# Patient Record
Sex: Female | Born: 1990 | Race: Black or African American | Hispanic: No | Marital: Single | Smoking: Former smoker
Health system: Southern US, Community
[De-identification: ages and names within clinical notes are randomized; demographics above are authoritative.]

## PROBLEM LIST (undated history)

## (undated) DIAGNOSIS — O99213 Obesity complicating pregnancy, third trimester: Principal | ICD-10-CM

## (undated) DIAGNOSIS — K219 Gastro-esophageal reflux disease without esophagitis: Secondary | ICD-10-CM

## (undated) DIAGNOSIS — E669 Obesity, unspecified: Secondary | ICD-10-CM

## (undated) DIAGNOSIS — M549 Dorsalgia, unspecified: Secondary | ICD-10-CM

## (undated) HISTORY — PX: NO PAST SURGERIES: SHX2092

---
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
## 2010-01-28 ENCOUNTER — Emergency Department (HOSPITAL_COMMUNITY): Admission: EM | Admit: 2010-01-28 | Discharge: 2010-01-28 | Payer: Self-pay | Admitting: Family Medicine

## 2010-09-26 LAB — POCT URINALYSIS DIP (DEVICE)
Hgb urine dipstick: NEGATIVE
Nitrite: NEGATIVE
Protein, ur: NEGATIVE mg/dL
Specific Gravity, Urine: 1.02 (ref 1.005–1.030)
Urobilinogen, UA: 0.2 mg/dL (ref 0.0–1.0)

## 2010-09-26 LAB — WET PREP, GENITAL
Clue Cells Wet Prep HPF POC: NONE SEEN
Trich, Wet Prep: NONE SEEN

## 2010-09-26 LAB — POCT PREGNANCY, URINE: Preg Test, Ur: NEGATIVE

## 2010-09-29 LAB — POCT URINALYSIS DIP (DEVICE)
Glucose, UA: NEGATIVE mg/dL
Nitrite: NEGATIVE

## 2010-09-29 LAB — URINE CULTURE: Colony Count: >=100000

## 2010-09-29 LAB — WET PREP, GENITAL
Clue Cells Wet Prep HPF POC: NONE SEEN
Trich, Wet Prep: NONE SEEN

## 2010-10-15 LAB — URINALYSIS, ROUTINE W REFLEX MICROSCOPIC
Leukocytes, UA: NEGATIVE
Nitrite: NEGATIVE
Protein, ur: NEGATIVE mg/dL
Urobilinogen, UA: 0.2 mg/dL (ref 0.0–1.0)

## 2010-10-15 LAB — PREGNANCY, URINE: Preg Test, Ur: NEGATIVE

## 2010-10-15 LAB — URINE MICROSCOPIC-ADD ON

## 2011-04-16 LAB — URINALYSIS, ROUTINE W REFLEX MICROSCOPIC
Bilirubin Urine: NEGATIVE
Glucose, UA: NEGATIVE mg/dL
Hgb urine dipstick: NEGATIVE
Ketones, ur: NEGATIVE mg/dL
Nitrite: NEGATIVE
Protein, ur: NEGATIVE mg/dL
Specific Gravity, Urine: 1.009 (ref 1.005–1.030)
Urobilinogen, UA: 0.2 mg/dL (ref 0.0–1.0)
pH: 6.5 (ref 5.0–8.0)

## 2011-04-16 LAB — PREGNANCY, URINE: Preg Test, Ur: NEGATIVE

## 2011-08-24 ENCOUNTER — Emergency Department (INDEPENDENT_AMBULATORY_CARE_PROVIDER_SITE_OTHER)
Admission: EM | Admit: 2011-08-24 | Discharge: 2011-08-24 | Disposition: A | Discharge status: Home / Self Care | Payer: Medicaid Other | Source: Home / Self Care | Attending: Family Medicine | Admitting: Family Medicine

## 2011-08-24 ENCOUNTER — Encounter (HOSPITAL_COMMUNITY): Payer: Self-pay | Admitting: *Deleted

## 2011-08-24 DIAGNOSIS — N760 Acute vaginitis: Secondary | ICD-10-CM

## 2011-08-24 DIAGNOSIS — S93409A Sprain of unspecified ligament of unspecified ankle, initial encounter: Secondary | ICD-10-CM

## 2011-08-24 DIAGNOSIS — S96919A Strain of unspecified muscle and tendon at ankle and foot level, unspecified foot, initial encounter: Secondary | ICD-10-CM

## 2011-08-24 LAB — POCT URINALYSIS DIP (DEVICE)
Ketones, ur: NEGATIVE mg/dL
Protein, ur: NEGATIVE mg/dL
Specific Gravity, Urine: >=1.03 (ref 1.005–1.030)
pH: 6 (ref 5.0–8.0)

## 2011-08-24 LAB — WET PREP, GENITAL

## 2011-08-24 MED ORDER — IBUPROFEN 600 MG PO TABS: 600.0000 mg | ORAL_TABLET | Freq: Three times a day (TID) | ORAL | Status: AC | PRN

## 2011-08-24 MED ORDER — FLUCONAZOLE 150 MG PO TABS: ORAL_TABLET | ORAL | Status: DC

## 2011-08-24 MED ORDER — METRONIDAZOLE 500 MG PO TABS: 500.0000 mg | ORAL_TABLET | Freq: Two times a day (BID) | ORAL | Status: AC

## 2011-08-24 NOTE — Discharge Instructions (Signed)
Take the prescribed medications as instructed. Removal of ankle brace to do stretching exercises once pain is better at least 3 times a day. Follow recommendations as per handout provided today. Return if worsening symptoms despite following treatment

## 2011-08-24 NOTE — ED Notes (Signed)
PT  REPORTS  VAGINAL  DISCHARGE   AND           R  FOOT  /  ANKLE  PAIN         -  SHE REPORTS  THE    ANKLE  HAS  BEEN  BOTHERING HER    FOR  ABUT  TWO  WEEKS  AS  WELL AS  THE  DIISCHARGE        SHE  DENYS   N ANY  BLEEDING OR  ANY  URINARY  SYMPTOMS

## 2011-08-24 NOTE — ED Provider Notes (Signed)
History     CSN: [id number]  Arrival date & time 08/24/11  1608   First MD Initiated Contact with Patient 08/24/11 1728      Chief Complaint  Patient presents with  . Vaginal Discharge    (Consider location/radiation/quality/duration/timing/severity/associated sxs/prior treatment) HPI Comments: 21 year old morbidly of these female here complaining of #1) vaginal irritation itchiness and white discharge for a month worse in the last 2 days has been treated for Candida vaginal infection in the past patient, is nulliparous not using birth control she has a same sex partner. Denies dysuria fever, chills, pelvic pain, nausea, vomiting or diarrhea   #2) right ankle pain for over a week she thinks she twisted her ankle over a week ago has not develop swelling but has noticed tenderness with putting weight on it when walking not taking any pain medications at this point wanted me to check. Patient weights over 300 pounds trying to lose weight.     History reviewed. No pertinent past medical history.  History reviewed. No pertinent past surgical history.  No family history on file.  History  Substance Use Topics  . Smoking status: Not on file  . Smokeless tobacco: Not on file  . Alcohol Use: Not on file    OB History    Grav Para Term Preterm Abortions TAB SAB Ect Mult Living                  Review of Systems  Constitutional: Negative for fever and chills.  Genitourinary: Positive for vaginal discharge. Negative for dysuria, frequency, hematuria and vaginal bleeding.  Musculoskeletal:       As per HPI  Neurological: Negative for dizziness and headaches.    Allergies  Review of patient's allergies indicates not on file.  Home Medications   Current Outpatient Rx  Name Route Sig Dispense Refill  . FLUCONAZOLE 150 MG PO TABS  1 tab po q72 hours x3 3 tablet 0  . IBUPROFEN 600 MG PO TABS Oral Take 1 tablet (600 mg total) by mouth every 8 (eight) hours as needed for pain.  20 tablet 0  . METRONIDAZOLE 500 MG PO TABS Oral Take 1 tablet (500 mg total) by mouth 2 (two) times daily. 14 tablet 0    BP 116/72  Pulse 70  Temp(Src) 98.6 F (37 C) (Oral)  Resp 18  LMP 08/12/2011  Physical Exam  Nursing note and vitals reviewed. Constitutional: She appears well-developed and well-nourished. No distress.  HENT:  Head: Normocephalic and atraumatic.  Cardiovascular: Normal heart sounds.   Pulmonary/Chest: Breath sounds normal.  Abdominal: Hernia confirmed negative in the right inguinal area and confirmed negative in the left inguinal area.  Genitourinary: Uterus normal. There is no rash, tenderness or lesion on the right labia. There is no rash, tenderness or lesion on the left labia. Cervix exhibits no motion tenderness, no discharge and no friability. Right adnexum displays no mass, no tenderness and no fullness. Left adnexum displays no mass, no tenderness and no fullness. There is erythema around the vagina. No tenderness or bleeding around the vagina. Vaginal discharge found.  Musculoskeletal:       Right ankle: She exhibits decreased range of motion. She exhibits no swelling, no ecchymosis, no deformity, no laceration and normal pulse. tenderness. Lateral malleolus tenderness found. No medial malleolus and no proximal fibula tenderness found.       Feet:  Lymphadenopathy:       Right: No inguinal adenopathy present.  Left: No inguinal adenopathy present.    ED Course  Procedures (including critical care time)  Labs Reviewed  WET PREP, GENITAL - Abnormal; Notable for the following:    Trich, Wet Prep MANY (*)    Clue Cells Wet Prep HPF POC FEW (*)    WBC, Wet Prep HPF POC FEW (*)    All other components within normal limits  POCT URINALYSIS DIP (DEVICE) - Abnormal; Notable for the following:    Leukocytes, UA TRACE (*) Biochemical Testing Only. Please order routine urinalysis from main lab if confirmatory testing is needed.   All other components  within normal limits  GC/CHLAMYDIA PROBE AMP, GENITAL  POCT PREGNANCY, URINE   No results found.   1. Vulvovaginitis   2. Ankle strain       MDM  Vulvovaginitis treated empirically with flagyl and diflucan. ASO brace placed on left ankle, motrin. Rehab exercises hand out.         Sharin Grave, MD 08/26/11 1610

## 2011-08-25 LAB — GC/CHLAMYDIA PROBE AMP, GENITAL
Chlamydia, DNA Probe: NEGATIVE
GC Probe Amp, Genital: NEGATIVE

## 2011-08-26 ENCOUNTER — Telehealth (HOSPITAL_COMMUNITY): Payer: Self-pay | Admitting: *Deleted

## 2011-08-26 NOTE — ED Notes (Signed)
1429 GC/Chlamydia neg., Wet prep: Few clue cells, many trich, few WBC's. I called pt. and left message to call. Vassie Moselle 08/26/2011

## 2011-08-26 NOTE — ED Notes (Signed)
1433 Pt. called back and verified x 2. Pt. given results and told she was adeq. treated for trich with Flagyl. Instructed to notify her partner, no sex until she finished the medication and her partner has been treated and to practice safe sex. Pt. told she can get HIV testing at he [facility] clinic. Vassie Moselle 08/26/2011

## 2011-08-26 NOTE — ED Notes (Signed)
Pt. said she went to the pharmacy but they only gave 3 pills. I told her that is fluconazole but she does not need that ( no yeast on wet prep). I told her she needs the Flagyl # 14 for trich. She said they gave her the wrong medicine. She said that she and her Mom are going back to the pharmacy now and exchange it . I told her I did not think they would take it back. I told her to keep it in case she develops a yeast infection from taking the Flagyl. Vassie Moselle 08/26/2011

## 2011-09-01 ENCOUNTER — Telehealth (HOSPITAL_COMMUNITY): Payer: Self-pay | Admitting: *Deleted

## 2011-09-01 NOTE — ED Notes (Signed)
Pt. called and said she does not smell the odor anymore. I told her that is because the infection is clearing up. I told her, she still needs to finish all of the medication. C/o sores on her body and she got a medication for it but does not remember the name of it. [facility] charts reviewed and did not find any complaints of sores or Rx. for this.  I told pt. she would have to see the doctor to see if she needs an antibiotic for it.  She thinks it might be ringworm. I told that would need to treated also with an antifungal. Pt.'s Mom asked who she was talking to and pt. hung up. Vassie Moselle 09/01/2011

## 2011-09-10 ENCOUNTER — Telehealth (HOSPITAL_COMMUNITY): Payer: Self-pay | Admitting: *Deleted

## 2011-09-10 NOTE — ED Notes (Signed)
Pt. called and said she is having having "itching down there." Symptoms started back after using a certain soap. I told pt. she would need to come back in an be rechecked to see what medication is needed. Kim Hudson 09/10/2011

## 2011-12-27 ENCOUNTER — Emergency Department (HOSPITAL_COMMUNITY)
Admission: EM | Admit: 2011-12-27 | Discharge: 2011-12-27 | Disposition: A | Discharge status: Home / Self Care | Payer: Medicaid Other | Attending: Emergency Medicine | Admitting: Emergency Medicine

## 2011-12-27 ENCOUNTER — Encounter (HOSPITAL_COMMUNITY): Payer: Self-pay | Admitting: Emergency Medicine

## 2011-12-27 DIAGNOSIS — J069 Acute upper respiratory infection, unspecified: Secondary | ICD-10-CM | POA: Insufficient documentation

## 2011-12-27 DIAGNOSIS — H9209 Otalgia, unspecified ear: Secondary | ICD-10-CM | POA: Insufficient documentation

## 2011-12-27 DIAGNOSIS — H9201 Otalgia, right ear: Secondary | ICD-10-CM

## 2011-12-27 DIAGNOSIS — J029 Acute pharyngitis, unspecified: Secondary | ICD-10-CM | POA: Insufficient documentation

## 2011-12-27 MED ORDER — ANTIPYRINE-BENZOCAINE 5.4-1.4 % OT SOLN
3.0000 [drp] | Freq: Once | OTIC | Status: AC
  Administered 2011-12-27: 3 [drp] via OTIC
  Filled 2011-12-27: qty 10

## 2011-12-27 MED ORDER — ANTIPYRINE-BENZOCAINE 5.4-1.4 % OT SOLN: 3.0000 [drp] | OTIC | Status: AC | PRN

## 2011-12-27 NOTE — ED Notes (Signed)
No answer

## 2011-12-27 NOTE — ED Provider Notes (Signed)
I saw and evaluated the patient, reviewed the resident's note and I agree with the findings and plan.   Darcy Barbara B. Bernette Mayers, MD 12/27/11 2302

## 2011-12-27 NOTE — Discharge Instructions (Signed)
Eardrops You have been diagnosed with a condition requiring you to put drops of medication into your outer ear. HOME CARE INSTRUCTIONS   Put drops in the affected ear as instructed. After putting the drops in, you will need to lay down with the affected ear facing up for ten minutes so the drops will remain in the ear canal and run down and fill the canal. Continue using eardrops for as long as directed by your caregiver.   Prior to getting up, put a cotton ball gently in your ear canal. Leave this plug out far enough so it can be easily removed. Do not attempt to push this down into the canal with a Q-tip or other instrument.   Do not irrigate or wash out your ears if you have had a perforated eardrum or mastoid surgery, or unless instructed to do so by your caregiver.   Keep appointments with your caregiver as instructed.   Finish all medications, or use for the length of time as instructed. Continue the drops even if your problem seems to be doing well after a couple days, or continue as instructed.  Return to see your caregiver if you are unsuccessful with the above instructions for home care.  SEEK MEDICAL CARE IF:  You become worse or develop increasing pain.   You notice any unusual drainage from your ear.   You develop hearing difficulties.   You have any other questions or concerns.  MAKE SURE YOU:   Understand these instructions.   Will watch your condition.   Will get help right away if you are not doing well or get worse.  Document Released: 06/22/2001 Document Revised: 06/17/2011 Document Reviewed: 01/14/2009 [facility] Patient Information 2012 [place], [place].Otalgia The most common reason for this in children is an infection of the middle ear. Pain from the middle ear is usually caused by a build-up of fluid and pressure behind the eardrum. Pain from an earache can be sharp, dull, or burning. The pain may be temporary or constant. The middle ear is connected to the nasal  passages by a short narrow tube called the Eustachian tube. The Eustachian tube allows fluid to drain out of the middle ear, and helps keep the pressure in your ear equalized. CAUSES  A cold or allergy can block the Eustachian tube with inflammation and the build-up of secretions. This is especially likely in small children, because their Eustachian tube is shorter and more horizontal. When the Eustachian tube closes, the normal flow of fluid from the middle ear is stopped. Fluid can accumulate and cause stuffiness, pain, hearing loss, and an ear infection if germs start growing in this area. SYMPTOMS  The symptoms of an ear infection may include fever, ear pain, fussiness, increased crying, and irritability. Many children will have temporary and minor hearing loss during and right after an ear infection. Permanent hearing loss is rare, but the risk increases the more infections a child has. Other causes of ear pain include retained water in the outer ear canal from swimming and bathing. Ear pain in adults is less likely to be from an ear infection. Ear pain may be referred from other locations. Referred pain may be from the joint between your jaw and the skull. It may also come from a tooth problem or problems in the neck. Other causes of ear pain include:  A foreign body in the ear.   Outer ear infection.   Sinus infections.   Impacted ear wax.   Ear injury.  Arthritis of the jaw or TMJ problems.   Middle ear infection.   Tooth infections.   Sore throat with pain to the ears.  DIAGNOSIS  Your caregiver can usually make the diagnosis by examining you. Sometimes other special studies, including x-rays and lab work may be necessary. TREATMENT   If antibiotics were prescribed, use them as directed and finish them even if you or your child's symptoms seem to be improved.   Sometimes PE tubes are needed in children. These are little plastic tubes which are put into the eardrum during a  simple surgical procedure. They allow fluid to drain easier and allow the pressure in the middle ear to equalize. This helps relieve the ear pain caused by pressure changes.  HOME CARE INSTRUCTIONS   Only take over-the-counter or prescription medicines for pain, discomfort, or fever as directed by your caregiver. DO NOT GIVE CHILDREN ASPIRIN because of the association of Reye's Syndrome in children taking aspirin.   Use a cold pack applied to the outer ear for 15 to 20 minutes, 3 to 4 times per day or as needed may reduce pain. Do not apply ice directly to the skin. You may cause frost bite.   Over-the-counter ear drops used as directed may be effective. Your caregiver may sometimes prescribe ear drops.   Resting in an upright position may help reduce pressure in the middle ear and relieve pain.   Ear pain caused by rapidly descending from high altitudes can be relieved by swallowing or chewing gum. Allowing infants to suck on a bottle during airplane travel can help.   Do not smoke in the house or near children. If you are unable to quit smoking, smoke outside.   Control allergies.  SEEK IMMEDIATE MEDICAL CARE IF:   You or your child are becoming sicker.   Pain or fever relief is not obtained with medicine.   You or your child's symptoms (pain, fever, or irritability) do not improve within 24 to 48 hours or as instructed.   Severe pain suddenly stops hurting. This may indicate a ruptured eardrum.   You or your children develop new problems such as severe headaches, stiff neck, difficulty swallowing, or swelling of the face or around the ear.  Document Released: 02/13/2004 Document Revised: 06/17/2011 Document Reviewed: 06/19/2008 [facility] Patient Information 2012 [place], [place].Sore Throat Sore throats may be caused by bacteria and viruses. They may also be caused by:  Smoking.   Pollution.   Allergies.  If a sore throat is due to strep infection (a bacterial infection), you may  need:  A throat swab.   A culture test to verify the strep infection.  You will need one of these:  An antibiotic shot.   Oral medicine for a full 10 days.  Strep infection is very contagious. A doctor should check any close contacts who have a sore throat or fever. A sore throat caused by a virus infection will usually last only 3-4 days. Antibiotics will not treat a viral sore throat.  Infectious mononucleosis (a viral disease), however, can cause a sore throat that lasts for up to 3 weeks. Mononucleosis can be diagnosed with blood tests. You must have been sick for at least 1 week in order for the test to give accurate results. HOME CARE INSTRUCTIONS   To treat a sore throat, take mild pain medicine.   Increase your fluids.   Eat a soft diet.   Do not smoke.   Gargling with warm water or salt  water (1 tsp. salt in 8 oz. water) can be helpful.   Try throat sprays or lozenges or sucking on hard candy to ease the symptoms.  Call your doctor if your sore throat lasts longer than 1 week.  SEEK IMMEDIATE MEDICAL CARE IF:  You have difficulty breathing.   You have increased swelling in the throat.   You have pain so severe that you are unable to swallow fluids or your saliva.   You have a severe headache, a high fever, vomiting, or a red rash.  Document Released: 08/05/2004 Document Revised: 06/17/2011 Document Reviewed: 06/15/2007 [facility] Patient Information 2012 [place], [place].

## 2011-12-27 NOTE — ED Provider Notes (Signed)
History     CSN: [id number]  Arrival date & time 12/27/11  2059   First MD Initiated Contact with Patient 12/27/11 2241      Chief Complaint  Patient presents with  . Sore Throat    (Consider location/radiation/quality/duration/timing/severity/associated sxs/prior treatment) Patient is a 21 y.o. female presenting with pharyngitis. The history is provided by the patient.  Sore Throat This is a new problem. The current episode started in the past 7 days (3 days ago). The problem occurs constantly. The problem has been unchanged. Associated symptoms include chills, congestion, coughing (non-productive) and a sore throat. Pertinent negatives include no abdominal pain, chest pain, fever, neck pain, rash or vomiting. Nothing aggravates the symptoms. She has tried rest for the symptoms. The treatment provided mild relief.    No past medical history on file.  No past surgical history on file.  No family history on file.  History  Substance Use Topics  . Smoking status: Not on file  . Smokeless tobacco: Not on file  . Alcohol Use: Not on file    OB History    Grav Para Term Preterm Abortions TAB SAB Ect Mult Living                  Review of Systems  Constitutional: Positive for chills. Negative for fever, activity change and appetite change.  HENT: Positive for congestion, sore throat and rhinorrhea. Negative for neck pain and neck stiffness.   Respiratory: Positive for cough (non-productive). Negative for chest tightness, shortness of breath and wheezing.   Cardiovascular: Negative for chest pain and palpitations.  Gastrointestinal: Negative for vomiting, abdominal pain, diarrhea and constipation.  Genitourinary: Negative for dysuria, decreased urine volume and difficulty urinating.  Skin: Negative for rash and wound.  Neurological: Negative for seizures, syncope, facial asymmetry and light-headedness.  Psychiatric/Behavioral: Negative for confusion and agitation.  All other  systems reviewed and are negative.    Allergies  Review of patient's allergies indicates no known allergies.  Home Medications  No current outpatient prescriptions on file.  BP 123/75  Pulse 89  Temp 99.3 F (37.4 C) (Oral)  Resp 18  SpO2 97%  LMP 12/26/2011  Physical Exam  Nursing note and vitals reviewed. Constitutional: She is oriented to person, place, and time. She appears well-developed and well-nourished.  HENT:  Head: Normocephalic and atraumatic.  Right Ear: External ear normal.  Left Ear: External ear normal.  Nose: Nose normal.  Mouth/Throat: Oropharyngeal exudate present.       No TTP overlying mastoid  Eyes: Conjunctivae are normal. Pupils are equal, round, and reactive to light.  Neck: Normal range of motion. Neck supple. No JVD present. No tracheal deviation present. No thyromegaly present.  Cardiovascular: Normal rate, regular rhythm, normal heart sounds and intact distal pulses.  Exam reveals no gallop and no friction rub.   No murmur heard. Pulmonary/Chest: Effort normal and breath sounds normal. No stridor. No respiratory distress. She has no wheezes. She has no rales. She exhibits no tenderness.  Musculoskeletal: Normal range of motion. She exhibits no edema and no tenderness.  Lymphadenopathy:    She has no cervical adenopathy.  Neurological: She is alert and oriented to person, place, and time.  Skin: Skin is warm and dry.  Psychiatric: She has a normal mood and affect. Her behavior is normal. Judgment and thought content normal.    ED Course  Procedures (including critical care time)   Labs Reviewed  RAPID STREP SCREEN   No results found.  1. Otalgia of right ear   2. Viral pharyngitis   3. Upper respiratory infection       MDM  21 yo F presents with several days of right ear pain and sore throat. CENTOR criteria 1 and negative strep screen; doubt bacterial pharyngitis. Clinical picture not c/w acute otitis media, otitis externa,  mastoiditis, or peritonsillar abscess. Patient given return precautions, including worsening of signs or symptoms. Patient instructed to follow-up with primary care physician.          Clemetine Marker, MD 12/27/11 2300

## 2011-12-27 NOTE — ED Notes (Signed)
Sore throat and ear ache in right ear since yesterday progressively getting worse; reports she has been running fevers.

## 2011-12-27 NOTE — ED Notes (Signed)
Patient is AOx4 and comfortable with her discharge instructions. 

## 2012-01-03 ENCOUNTER — Encounter (HOSPITAL_COMMUNITY): Payer: Self-pay

## 2012-01-03 ENCOUNTER — Emergency Department (INDEPENDENT_AMBULATORY_CARE_PROVIDER_SITE_OTHER)
Admission: EM | Admit: 2012-01-03 | Discharge: 2012-01-03 | Disposition: A | Discharge status: Home / Self Care | Payer: Medicaid Other | Source: Home / Self Care | Attending: Emergency Medicine | Admitting: Emergency Medicine

## 2012-01-03 DIAGNOSIS — A499 Bacterial infection, unspecified: Secondary | ICD-10-CM

## 2012-01-03 DIAGNOSIS — B9689 Other specified bacterial agents as the cause of diseases classified elsewhere: Secondary | ICD-10-CM

## 2012-01-03 DIAGNOSIS — N760 Acute vaginitis: Secondary | ICD-10-CM

## 2012-01-03 LAB — POCT URINALYSIS DIP (DEVICE)
Hgb urine dipstick: NEGATIVE
Leukocytes, UA: NEGATIVE
Nitrite: NEGATIVE
Protein, ur: NEGATIVE mg/dL
Urobilinogen, UA: 0.2 mg/dL (ref 0.0–1.0)
pH: 7.5 (ref 5.0–8.0)

## 2012-01-03 LAB — WET PREP, GENITAL

## 2012-01-03 LAB — POCT PREGNANCY, URINE: Preg Test, Ur: NEGATIVE

## 2012-01-03 LAB — RPR: RPR Ser Ql: NONREACTIVE

## 2012-01-03 MED ORDER — METRONIDAZOLE 500 MG PO TABS: 500.0000 mg | ORAL_TABLET | Freq: Two times a day (BID) | ORAL | Status: AC

## 2012-01-03 NOTE — Discharge Instructions (Signed)
Bacterial Vaginosis Bacterial vaginosis (BV) is a vaginal infection where the normal balance of bacteria in the vagina is disrupted. The normal balance is then replaced by an overgrowth of certain bacteria. There are several different kinds of bacteria that can cause BV. BV is the most common vaginal infection in women of childbearing age. CAUSES   The cause of BV is not fully understood. BV develops when there is an increase or imbalance of harmful bacteria.   Some activities or behaviors can upset the normal balance of bacteria in the vagina and put women at increased risk including:   Having a new sex partner or multiple sex partners.   Douching.   Using an intrauterine device (IUD) for contraception.   It is not clear what role sexual activity plays in the development of BV. However, women that have never had sexual intercourse are rarely infected with BV.  Women do not get BV from toilet seats, bedding, swimming pools or from touching objects around them.  SYMPTOMS   Grey vaginal discharge.   A fish-like odor with discharge, especially after sexual intercourse.   Itching or burning of the vagina and vulva.   Burning or pain with urination.   Some women have no signs or symptoms at all.  DIAGNOSIS  Your caregiver must examine the vagina for signs of BV. Your caregiver will perform lab tests and look at the sample of vaginal fluid through a microscope. They will look for bacteria and abnormal cells (clue cells), a pH test higher than 4.5, and a positive amine test all associated with BV.  RISKS AND COMPLICATIONS   Pelvic inflammatory disease (PID).   Infections following gynecology surgery.   Developing HIV.   Developing herpes virus.  TREATMENT  Sometimes BV will clear up without treatment. However, all women with symptoms of BV should be treated to avoid complications, especially if gynecology surgery is planned. Female partners generally do not need to be treated. However,  BV may spread between female sex partners so treatment is helpful in preventing a recurrence of BV.   BV may be treated with antibiotics. The antibiotics come in either pill or vaginal cream forms. Either can be used with nonpregnant or pregnant women, but the recommended dosages differ. These antibiotics are not harmful to the baby.   BV can recur after treatment. If this happens, a second round of antibiotics will often be prescribed.   Treatment is important for pregnant women. If not treated, BV can cause a premature delivery, especially for a pregnant woman who had a premature birth in the past. All pregnant women who have symptoms of BV should be checked and treated.   For chronic reoccurrence of BV, treatment with a type of prescribed gel vaginally twice a week is helpful.  HOME CARE INSTRUCTIONS   Finish all medication as directed by your caregiver.   Do not have sex until treatment is completed.   Tell your sexual partner that you have a vaginal infection. They should see their caregiver and be treated if they have problems, such as a mild rash or itching.   Practice safe sex. Use condoms. Only have 1 sex partner.  PREVENTION  Basic prevention steps can help reduce the risk of upsetting the natural balance of bacteria in the vagina and developing BV:  Do not have sexual intercourse (be abstinent).   Do not douche.   Use all of the medicine prescribed for treatment of BV, even if the signs and symptoms go away.     Tell your sex partner if you have BV. That way, they can be treated, if needed, to prevent reoccurrence.  SEEK MEDICAL CARE IF:   Your symptoms are not improving after 3 days of treatment.   You have increased discharge, pain, or fever.  MAKE SURE YOU:   Understand these instructions.   Will watch your condition.   Will get help right away if you are not doing well or get worse.  FOR MORE INFORMATION  Division of STD Prevention (DSTDP), Centers for Disease  Control and Prevention: [employer] American [employer] Health Association (ASHA): www.ashastd.org  Document Released: 06/28/2005 Document Revised: 06/17/2011 Document Reviewed: 12/19/2008 [facility] Patient Information 2012 [place], [place].    To restore the normal balance of "good bacteria" in your system.  Take a probiotic once daily.  These can be gotten over the counter at the drug store without a prescription and come under various brand names such as Culturelle, Align, Florastore, and [employer].  The best thing to do is to ask your pharmacist to recommend a good probiotic that is not too expensive.

## 2012-01-03 NOTE — ED Notes (Signed)
C/o vaginal d/c, odor since yesterday; sexually active w/o NAY protection, but yet states not attempting to get pregnant

## 2012-01-03 NOTE — ED Provider Notes (Signed)
Chief Complaint  Patient presents with  . SEXUALLY TRANSMITTED DISEASE    History of Present Illness:   The patient is a 21 year old female with a two-day history of a white, malodorous vaginal discharge. She denies any vaginal irritation, pain, or itching. She has no pelvic pain. She denies any history of fever, chills, nausea, vomiting, or urinary symptoms. She has a history of bacterial vaginosis in the past. No prior history of STDs. She does have a concern about STDs, since she feels that the symptoms got worse after intercourse with a new partner. Her menses have been regular, last menstrual period was June 14 and was normal. She is sexually active and does not use any birth control or STD protection.  Review of Systems:  Other than noted above, the patient denies any of the following symptoms: Systemic:  No fever, chills, sweats, fatigue, or weight loss. GI:  No abdominal pain, nausea, anorexia, vomiting, diarrhea, constipation, melena or hematochezia. GU:  No dysuria, frequency, urgency, hematuria, vaginal discharge, itching, or abnormal vaginal bleeding. Skin:  No rash or itching.   PMFSH:  Past medical history, family history, social history, meds, and allergies were reviewed.  Physical Exam:   Vital signs:  BP 113/74  Pulse 66  Temp 98.5 F (36.9 C) (Oral)  Resp 16  SpO2 99%  LMP 12/26/2011 General:  Alert, oriented and in no distress. Lungs:  Breath sounds clear and equal bilaterally.  No wheezes, rales or rhonchi. Heart:  Regular rhythm.  No gallops or murmers. Abdomen:  Soft, flat and non-distended.  No organomegaly or mass.  No tenderness, guarding or rebound.  Bowel sounds normally active. Pelvic exam:  Normal external genitalia, vaginal and cervical mucosa were normal, there is a small amount of non-malodorous, white discharge. No cervical motion tenderness. Uterus is mid position, normal in size and shape and nontender. No adnexal masses or tenderness. Skin:  Clear,  warm and dry.  Labs:   Results for orders placed during the hospital encounter of 01/03/12  POCT URINALYSIS DIP (DEVICE)      Component Value Range   Glucose, UA NEGATIVE  NEGATIVE mg/dL   Bilirubin Urine NEGATIVE  NEGATIVE   Ketones, ur NEGATIVE  NEGATIVE mg/dL   Specific Gravity, Urine 1.020  1.005 - 1.030   Hgb urine dipstick NEGATIVE  NEGATIVE   pH 7.5  5.0 - 8.0   Protein, ur NEGATIVE  NEGATIVE mg/dL   Urobilinogen, UA 0.2  0.0 - 1.0 mg/dL   Nitrite NEGATIVE  NEGATIVE   Leukocytes, UA NEGATIVE  NEGATIVE  POCT PREGNANCY, URINE      Component Value Range   Preg Test, Ur NEGATIVE  NEGATIVE   Other Labs Obtained at Urgent Care Center:  Wet prep and serologies for HIV and RPR were obtained as well as DNA probe for gonorrhea and Chlamydia.  Results are pending at this time and we will call about any positive results.   Assessment:  The encounter diagnosis was Bacterial vaginosis.  Plan:   1.  The following meds were prescribed:   New Prescriptions   METRONIDAZOLE (FLAGYL) 500 MG TABLET    Take 1 tablet (500 mg total) by mouth 2 (two) times daily.   2.  The patient was instructed in symptomatic care and handouts were given. 3.  The patient was told to return if becoming worse in any way, if no better in 3 or 4 days, and given some red flag symptoms that would indicate earlier return.  Reuben Likes, MD 01/03/12 [phone]

## 2012-01-04 LAB — HIV ANTIBODY (ROUTINE TESTING W REFLEX): HIV: NONREACTIVE

## 2012-01-04 NOTE — ED Notes (Signed)
GC/Chlamydia neg., Wet prep: mod. clue cells, few WBC's, HIV and RPR non-reactive.  Pt. adequately treated with Flagyl. Kim Hudson 01/04/2012

## 2012-02-06 ENCOUNTER — Emergency Department (HOSPITAL_COMMUNITY)
Admission: EM | Admit: 2012-02-06 | Discharge: 2012-02-06 | Disposition: A | Discharge status: Home / Self Care | Payer: Medicaid Other | Source: Home / Self Care

## 2013-01-15 ENCOUNTER — Emergency Department (HOSPITAL_COMMUNITY)
Admission: EM | Admit: 2013-01-15 | Discharge: 2013-01-15 | Disposition: A | Discharge status: Home / Self Care | Payer: No Typology Code available for payment source | Attending: Emergency Medicine | Admitting: Emergency Medicine

## 2013-01-15 ENCOUNTER — Encounter (HOSPITAL_COMMUNITY): Payer: Self-pay | Admitting: Emergency Medicine

## 2013-01-15 DIAGNOSIS — S8990XA Unspecified injury of unspecified lower leg, initial encounter: Secondary | ICD-10-CM | POA: Insufficient documentation

## 2013-01-15 DIAGNOSIS — M25562 Pain in left knee: Secondary | ICD-10-CM

## 2013-01-15 DIAGNOSIS — S99929A Unspecified injury of unspecified foot, initial encounter: Secondary | ICD-10-CM | POA: Insufficient documentation

## 2013-01-15 DIAGNOSIS — Y9389 Activity, other specified: Secondary | ICD-10-CM | POA: Insufficient documentation

## 2013-01-15 DIAGNOSIS — Y92410 Unspecified street and highway as the place of occurrence of the external cause: Secondary | ICD-10-CM | POA: Insufficient documentation

## 2013-01-15 MED ORDER — HYDROCODONE-ACETAMINOPHEN 5-325 MG PO TABS: 1.0000 | ORAL_TABLET | ORAL | Status: DC | PRN

## 2013-01-15 MED ORDER — IBUPROFEN 800 MG PO TABS: 800.0000 mg | ORAL_TABLET | Freq: Three times a day (TID) | ORAL | Status: DC

## 2013-01-15 MED ORDER — HYDROCODONE-ACETAMINOPHEN 5-325 MG PO TABS
2.0000 | ORAL_TABLET | Freq: Once | ORAL | Status: AC
  Administered 2013-01-15: 2 via ORAL
  Filled 2013-01-15: qty 2

## 2013-01-15 MED ORDER — IBUPROFEN 400 MG PO TABS
800.0000 mg | ORAL_TABLET | Freq: Once | ORAL | Status: AC
  Administered 2013-01-15: 800 mg via ORAL
  Filled 2013-01-15: qty 2
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
## 2013-01-16 NOTE — ED Provider Notes (Signed)
  Medical screening examination/treatment/procedure(s) were performed by non-physician practitioner and as supervising physician I was immediately available for consultation/collaboration.    [name], MD 01/16/13 [phone]

## 2014-04-16 ENCOUNTER — Encounter (HOSPITAL_COMMUNITY): Payer: Self-pay | Admitting: Emergency Medicine

## 2014-04-16 ENCOUNTER — Emergency Department (HOSPITAL_COMMUNITY)
Admission: EM | Admit: 2014-04-16 | Discharge: 2014-04-17 | Discharge status: Against Medical Advice | Payer: Medicaid Other | Attending: Emergency Medicine | Admitting: Emergency Medicine

## 2014-04-16 DIAGNOSIS — R1084 Generalized abdominal pain: Secondary | ICD-10-CM | POA: Diagnosis present

## 2014-04-16 NOTE — ED Notes (Signed)
Per EMS, patient here for generalized abdominal pain x3 weeks that increased today. Denies N/V. Took home preg test last week and it was negative. LMP now.

## 2014-04-16 NOTE — ED Notes (Signed)
Pt. Called and paged with no answer

## 2014-04-26 ENCOUNTER — Encounter (HOSPITAL_COMMUNITY): Payer: Self-pay | Admitting: Emergency Medicine

## 2014-04-26 ENCOUNTER — Emergency Department (HOSPITAL_COMMUNITY): Payer: Medicaid Other

## 2014-04-26 ENCOUNTER — Emergency Department (HOSPITAL_COMMUNITY)
Admission: EM | Admit: 2014-04-26 | Discharge: 2014-04-26 | Disposition: A | Discharge status: Home / Self Care | Payer: Medicaid Other | Attending: Emergency Medicine | Admitting: Emergency Medicine

## 2014-04-26 DIAGNOSIS — R109 Unspecified abdominal pain: Secondary | ICD-10-CM | POA: Insufficient documentation

## 2014-04-26 DIAGNOSIS — Z3202 Encounter for pregnancy test, result negative: Secondary | ICD-10-CM | POA: Diagnosis not present

## 2014-04-26 DIAGNOSIS — R1084 Generalized abdominal pain: Secondary | ICD-10-CM

## 2014-04-26 LAB — COMPREHENSIVE METABOLIC PANEL
ALT: 22 U/L (ref 0–35)
ANION GAP: 10 (ref 5–15)
AST: 20 U/L (ref 0–37)
Albumin: 3.6 g/dL (ref 3.5–5.2)
Alkaline Phosphatase: 111 U/L (ref 39–117)
BUN: 11 mg/dL (ref 6–23)
CALCIUM: 9 mg/dL (ref 8.4–10.5)
CO2: 27 mEq/L (ref 19–32)
Chloride: 100 mEq/L (ref 96–112)
Creatinine, Ser: 0.88 mg/dL (ref 0.50–1.10)
GLUCOSE: 94 mg/dL (ref 70–99)
Potassium: 4.1 mEq/L (ref 3.7–5.3)
SODIUM: 137 meq/L (ref 137–147)
Total Bilirubin: <0.2 mg/dL — ABNORMAL LOW (ref 0.3–1.2)
Total Protein: 7.4 g/dL (ref 6.0–8.3)

## 2014-04-26 LAB — URINALYSIS, ROUTINE W REFLEX MICROSCOPIC
Bilirubin Urine: NEGATIVE
Glucose, UA: NEGATIVE mg/dL
HGB URINE DIPSTICK: NEGATIVE
KETONES UR: NEGATIVE mg/dL
LEUKOCYTES UA: NEGATIVE
Nitrite: NEGATIVE
PROTEIN: NEGATIVE mg/dL
Specific Gravity, Urine: 1.009 (ref 1.005–1.030)
Urobilinogen, UA: 0.2 mg/dL (ref 0.0–1.0)
pH: 6 (ref 5.0–8.0)

## 2014-04-26 LAB — CBC WITH DIFFERENTIAL/PLATELET
Basophils Absolute: 0 10*3/uL (ref 0.0–0.1)
Basophils Relative: 0 % (ref 0–1)
EOS PCT: 1 % (ref 0–5)
Eosinophils Absolute: 0.1 10*3/uL (ref 0.0–0.7)
HEMATOCRIT: 39.4 % (ref 36.0–46.0)
HEMOGLOBIN: 13 g/dL (ref 12.0–15.0)
LYMPHS ABS: 2.6 10*3/uL (ref 0.7–4.0)
LYMPHS PCT: 45 % (ref 12–46)
MCH: 26.8 pg (ref 26.0–34.0)
MCHC: 33 g/dL (ref 30.0–36.0)
MCV: 81.2 fL (ref 78.0–100.0)
MONOS PCT: 8 % (ref 3–12)
Monocytes Absolute: 0.4 10*3/uL (ref 0.1–1.0)
Neutro Abs: 2.6 10*3/uL (ref 1.7–7.7)
Neutrophils Relative %: 46 % (ref 43–77)
Platelets: 276 10*3/uL (ref 150–400)
RBC: 4.85 MIL/uL (ref 3.87–5.11)
RDW: 13.1 % (ref 11.5–15.5)
WBC: 5.7 10*3/uL (ref 4.0–10.5)

## 2014-04-26 LAB — LIPASE, BLOOD: Lipase: 32 U/L (ref 11–59)

## 2014-04-26 LAB — PREGNANCY, URINE: Preg Test, Ur: NEGATIVE

## 2014-04-26 MED ORDER — GI COCKTAIL ~~LOC~~
30.0000 mL | Freq: Once | ORAL | Status: AC
  Administered 2014-04-26: 30 mL via ORAL
  Filled 2014-04-26: qty 30

## 2014-04-26 MED ORDER — FAMOTIDINE 20 MG PO TABS
20.0000 mg | ORAL_TABLET | Freq: Once | ORAL | Status: AC
  Administered 2014-04-26: 20 mg via ORAL
  Filled 2014-04-26: qty 1

## 2014-04-26 MED ORDER — OMEPRAZOLE 20 MG PO CPDR
DELAYED_RELEASE_CAPSULE | ORAL | Status: DC
Start: 2014-04-26 — End: 2015-05-05

## 2014-04-26 NOTE — ED Notes (Signed)
Pt. reports intermittent mid abdominal pain for 1 week , diarrhea x1 , denies nausea or vomitting , no fever or chills.

## 2014-04-26 NOTE — Discharge Instructions (Signed)
Avoid fried, spicy or greasy foods. Take the prilosec as prescribed. Recheck at your doctor if not improving in the next week. Return to the ED if you get a fever, have uncontrolled vomiting or the pain gets severe.

## 2014-04-26 NOTE — ED Provider Notes (Signed)
CSN: [id number]     Arrival date & time 04/26/14  0145 History   First MD Initiated Contact with Patient 04/26/14 0220     Chief Complaint  Patient presents with  . Abdominal Pain     (Consider location/radiation/quality/duration/timing/severity/associated sxs/prior Treatment) HPI Patient has been having diffuse abdominal pain off and on for the past week. She states it will last a few minutes when it comes. She states she gets it daily 3 or 4 times a day. The last episode was while she was in the lobby. She states nothing she does makes it start or  makes it feel worse. Nothing she does makes it feel better. She has not tried anything. She states when the pain first started last week it was sharp but now she states it is a "in between pain". She denies nausea, or vomiting. She denies dysuria, frequency, vaginal discharge. She has had a normal appetite. She denies constipation. She is G0 P0 and states her last normal period started October 5. She states she's never had this before.  [facility]  History reviewed. No pertinent past medical history. History reviewed. No pertinent past surgical history. No family history on file. History  Substance Use Topics  . Smoking status: Never Smoker   . Smokeless tobacco: Not on file  . Alcohol Use: Yes  rare alcohol On SSI for learning disability, graduated from HS  OB History   Grav Para Term Preterm Abortions TAB SAB Ect Mult Living                 Review of Systems  All other systems reviewed and are negative.     Allergies  Review of patient's allergies indicates no known allergies.  Home Medications   Prior to Admission medications   Medication Sig Start Date End Date Taking? Authorizing Provider  omeprazole (PRILOSEC) 20 MG capsule Take 1 po BID x 2 weeks then once a day 04/26/14   Ward GivensIva L Lujuana Kapler, MD   BP 114/61  Pulse 60  Temp(Src) 98.5 F (36.9 C) (Oral)  Resp 16  Wt 282 lb 1 oz (127.943 kg)  SpO2 98%   LMP 04/10/2014  Vital signs normal   Physical Exam  Nursing note and vitals reviewed. Constitutional: She is oriented to person, place, and time. She appears well-developed and well-nourished.  Non-toxic appearance. She does not appear ill. No distress.  HENT:  Head: Normocephalic and atraumatic.  Right Ear: External ear normal.  Left Ear: External ear normal.  Nose: Nose normal. No mucosal edema or rhinorrhea.  Mouth/Throat: Oropharynx is clear and moist and mucous membranes are normal. No dental abscesses or uvula swelling.  Eyes: Conjunctivae and EOM are normal. Pupils are equal, round, and reactive to light.  Neck: Normal range of motion and full passive range of motion without pain. Neck supple.  Cardiovascular: Normal rate, regular rhythm and normal heart sounds.  Exam reveals no gallop and no friction rub.   No murmur heard. Pulmonary/Chest: Effort normal and breath sounds normal. No respiratory distress. She has no wheezes. She has no rhonchi. She has no rales. She exhibits no tenderness and no crepitus.  Abdominal: Soft. Normal appearance and bowel sounds are normal. She exhibits no distension. There is no tenderness. There is no rebound and no guarding.  Musculoskeletal: Normal range of motion. She exhibits no edema and no tenderness.  Moves all extremities well.   Neurological: She is alert and oriented to person, place, and time. She  has normal strength. No cranial nerve deficit.  Skin: Skin is warm, dry and intact. No rash noted. No erythema. No pallor.  Psychiatric: She has a normal mood and affect. Her speech is normal and behavior is normal. Her mood appears not anxious.    ED Course  Procedures (including critical care time)  Medications  famotidine (PEPCID) tablet 20 mg (20 mg Oral Given 04/26/14 0241)  gi cocktail (Maalox,Lidocaine,Donnatal) (30 mLs Oral Given 04/26/14 0241)   Pt states her discomfort is improved, but not gone at time of discharge. She is  watching TV in NAD.  Labs Review Results for orders placed during the hospital encounter of 04/26/14  CBC WITH DIFFERENTIAL      Result Value Ref Range   WBC 5.7  4.0 - 10.5 K/uL   RBC 4.85  3.87 - 5.11 MIL/uL   Hemoglobin 13.0  12.0 - 15.0 g/dL   HCT 16.1  09.6 - 04.5 %   MCV 81.2  78.0 - 100.0 fL   MCH 26.8  26.0 - 34.0 pg   MCHC 33.0  30.0 - 36.0 g/dL   RDW 40.9  81.1 - 91.4 %   Platelets 276  150 - 400 K/uL   Neutrophils Relative % 46  43 - 77 %   Neutro Abs 2.6  1.7 - 7.7 K/uL   Lymphocytes Relative 45  12 - 46 %   Lymphs Abs 2.6  0.7 - 4.0 K/uL   Monocytes Relative 8  3 - 12 %   Monocytes Absolute 0.4  0.1 - 1.0 K/uL   Eosinophils Relative 1  0 - 5 %   Eosinophils Absolute 0.1  0.0 - 0.7 K/uL   Basophils Relative 0  0 - 1 %   Basophils Absolute 0.0  0.0 - 0.1 K/uL  COMPREHENSIVE METABOLIC PANEL      Result Value Ref Range   Sodium 137  137 - 147 mEq/L   Potassium 4.1  3.7 - 5.3 mEq/L   Chloride 100  96 - 112 mEq/L   CO2 27  19 - 32 mEq/L   Glucose, Bld 94  70 - 99 mg/dL   BUN 11  6 - 23 mg/dL   Creatinine, Ser 7.82  0.50 - 1.10 mg/dL   Calcium 9.0  8.4 - 95.6 mg/dL   Total Protein 7.4  6.0 - 8.3 g/dL   Albumin 3.6  3.5 - 5.2 g/dL   AST 20  0 - 37 U/L   ALT 22  0 - 35 U/L   Alkaline Phosphatase 111  39 - 117 U/L   Total Bilirubin <0.2 (*) 0.3 - 1.2 mg/dL   GFR calc non Af Amer >90  >90 mL/min   GFR calc Af Amer >90  >90 mL/min   Anion gap 10  5 - 15  LIPASE, BLOOD      Result Value Ref Range   Lipase 32  11 - 59 U/L  URINALYSIS, ROUTINE W REFLEX MICROSCOPIC      Result Value Ref Range   Color, Urine YELLOW  YELLOW   APPearance CLEAR  CLEAR   Specific Gravity, Urine 1.009  1.005 - 1.030   pH 6.0  5.0 - 8.0   Glucose, UA NEGATIVE  NEGATIVE mg/dL   Hgb urine dipstick NEGATIVE  NEGATIVE   Bilirubin Urine NEGATIVE  NEGATIVE   Ketones, ur NEGATIVE  NEGATIVE mg/dL   Protein, ur NEGATIVE  NEGATIVE mg/dL   Urobilinogen, UA 0.2  0.0 - 1.0 mg/dL  Nitrite NEGATIVE   NEGATIVE   Leukocytes, UA NEGATIVE  NEGATIVE  PREGNANCY, URINE      Result Value Ref Range   Preg Test, Ur NEGATIVE  NEGATIVE   Laboratory interpretation all normal   Imaging Review Dg Abd Acute W/chest  04/26/2014   CLINICAL DATA:  Abdominal pain for 1 week. Intermittent and diffuse. Initial encounter.  EXAM: ACUTE ABDOMEN SERIES (ABDOMEN 2 VIEW & CHEST 1 VIEW)  COMPARISON:  None.  FINDINGS: There is no evidence of dilated bowel loops or free intraperitoneal air. No radiopaque calculi or other significant radiographic abnormality is seen.  Heart size is within normal limits. Indeterminate mild flaring of the lower posterior mediastinal silhouette on frontal chest radiography given the finding is not seen on abdominal imaging.  IMPRESSION: Negative abdominal radiographs.  No acute cardiopulmonary disease.   Electronically Signed   By: Tiburcio PeaJonathan  Watts M.D.   On: 04/26/2014 03:22     EKG Interpretation None      MDM   Final diagnoses:  Diffuse abdominal pain    New Prescriptions   OMEPRAZOLE (PRILOSEC) 20 MG CAPSULE    Take 1 po BID x 2 weeks then once a day    Plan discharge   Devoria AlbeIva Marai Teehan, MD, Franz DellFACEP     Dareion Kneece L Quincey Nored, MD 04/26/14 [phone]

## 2014-06-15 ENCOUNTER — Encounter (HOSPITAL_COMMUNITY): Payer: Self-pay | Admitting: *Deleted

## 2014-06-15 ENCOUNTER — Inpatient Hospital Stay (HOSPITAL_COMMUNITY)
Admission: AD | Admit: 2014-06-15 | Discharge: 2014-06-15 | Disposition: A | Discharge status: Home / Self Care | Payer: Medicaid Other | Source: Ambulatory Visit | Attending: Family Medicine | Admitting: Family Medicine

## 2014-06-15 DIAGNOSIS — A5901 Trichomonal vulvovaginitis: Secondary | ICD-10-CM | POA: Diagnosis not present

## 2014-06-15 DIAGNOSIS — N926 Irregular menstruation, unspecified: Secondary | ICD-10-CM | POA: Insufficient documentation

## 2014-06-15 LAB — WET PREP, GENITAL
Clue Cells Wet Prep HPF POC: NONE SEEN
Yeast Wet Prep HPF POC: NONE SEEN

## 2014-06-15 LAB — URINALYSIS, ROUTINE W REFLEX MICROSCOPIC
BILIRUBIN URINE: NEGATIVE
Glucose, UA: NEGATIVE mg/dL
HGB URINE DIPSTICK: NEGATIVE
Ketones, ur: NEGATIVE mg/dL
Leukocytes, UA: NEGATIVE
Nitrite: NEGATIVE
PH: 6 (ref 5.0–8.0)
Protein, ur: NEGATIVE mg/dL
SPECIFIC GRAVITY, URINE: 1.01 (ref 1.005–1.030)
Urobilinogen, UA: 0.2 mg/dL (ref 0.0–1.0)

## 2014-06-15 LAB — POCT PREGNANCY, URINE: Preg Test, Ur: NEGATIVE

## 2014-06-15 MED ORDER — METRONIDAZOLE 500 MG PO TABS
2000.0000 mg | ORAL_TABLET | Freq: Once | ORAL | Status: AC
  Administered 2014-06-15: 2000 mg via ORAL

## 2014-06-15 NOTE — MAU Provider Note (Signed)
History     CSN: [id number]  Arrival date and time: 06/15/14 1527   First Provider Initiated Contact with Patient 06/15/14 1646      Chief Complaint  Patient presents with  . Menstrual Problem   HPIpt is not pregnancy and concerned about unusual period lasting shorter than previous period.  Pt has not had IC since OCt   Rn note: Laureen OchsScarlett D Murray, RN Registered Nurse Signed  MAU Note 06/15/2014 3:51 PM    Expand All Collapse All   [facility]       Past Medical History  Diagnosis Date  . Medical history non-contributory     Past Surgical History  Procedure Laterality Date  . No past surgeries      History reviewed. No pertinent family history.  History  Substance Use Topics  . Smoking status: Never Smoker   . Smokeless tobacco: Never Used  . Alcohol Use: No    Allergies: No Known Allergies  Prescriptions prior to admission  Medication Sig Dispense Refill Last Dose  . omeprazole (PRILOSEC) 20 MG capsule Take 1 po BID x 2 weeks then once a day (Patient not taking: Reported on 06/15/2014) 60 capsule 0     Review of Systems  Constitutional: Negative for fever and chills.  Gastrointestinal: Negative for nausea, vomiting, abdominal pain, diarrhea and constipation.  Genitourinary: Negative for dysuria and urgency.   Physical Exam   Blood pressure 128/67, pulse 99, temperature 98.2 F (36.8 C), temperature source Oral, resp. rate 20, height 5\' 7"  (1.702 m), weight 287 lb (130.182 kg), last menstrual period 06/07/2014.  Physical Exam  Nursing note and vitals reviewed. Constitutional: She is oriented to person, place, and time. She appears well-developed and well-nourished. No distress.  HENT:  Head: Normocephalic.  Eyes: Pupils are equal, round, and reactive to light.  Neck: Normal range of motion. Neck supple.  Cardiovascular: Normal rate.   Respiratory: Effort normal.  GI: Soft.  She exhibits no distension. There is no tenderness.  Genitourinary:  Small amount of frothy discharge in vatul; cervix clean, NT; uterus NSSC, NT ;adnexa without palpable enlargement or tenderness  Musculoskeletal: Normal range of motion.  Neurological: She is alert and oriented to person, place, and time.  Skin: Skin is warm and dry.  Psychiatric: She has a normal mood and affect.    MAU Course  Procedures Results for orders placed or performed during the hospital encounter of 06/15/14 (from the past 24 hour(s))  Urinalysis, Routine w reflex microscopic     Status: None   Collection Time: 06/15/14  3:40 PM  Result Value Ref Range   Color, Urine YELLOW YELLOW   APPearance CLEAR CLEAR   Specific Gravity, Urine 1.010 1.005 - 1.030   pH 6.0 5.0 - 8.0   Glucose, UA NEGATIVE NEGATIVE mg/dL   Hgb urine dipstick NEGATIVE NEGATIVE   Bilirubin Urine NEGATIVE NEGATIVE   Ketones, ur NEGATIVE NEGATIVE mg/dL   Protein, ur NEGATIVE NEGATIVE mg/dL   Urobilinogen, UA 0.2 0.0 - 1.0 mg/dL   Nitrite NEGATIVE NEGATIVE   Leukocytes, UA NEGATIVE NEGATIVE  Pregnancy, urine POC     Status: None   Collection Time: 06/15/14  4:17 PM  Result Value Ref Range   Preg Test, Ur NEGATIVE NEGATIVE  Wet prep, genital     Status: Abnormal   Collection Time: 06/15/14  4:48 PM  Result Value Ref Range   Yeast Wet Prep HPF POC NONE SEEN  NONE SEEN   Trich, Wet Prep MANY (A) NONE SEEN   Clue Cells Wet Prep HPF POC NONE SEEN NONE SEEN   WBC, Wet Prep HPF POC MODERATE (A) NONE SEEN  Trich- treated with Flagyl 2 gm PO GC/chlamydia pending Assessment and Plan  Trichomonas vaginitis- Flagyl 2 gm in MAU  Dawid Dupriest 06/15/2014, 5:45 PM

## 2014-06-15 NOTE — MAU Note (Signed)
[facility].

## 2014-06-17 LAB — GC/CHLAMYDIA PROBE AMP
CT PROBE, AMP APTIMA: NEGATIVE
GC Probe RNA: NEGATIVE

## 2014-06-25 ENCOUNTER — Encounter (HOSPITAL_COMMUNITY): Payer: Self-pay | Admitting: *Deleted

## 2014-06-25 ENCOUNTER — Inpatient Hospital Stay (HOSPITAL_COMMUNITY)
Admission: AD | Admit: 2014-06-25 | Discharge: 2014-06-25 | Disposition: A | Discharge status: Home / Self Care | Payer: Medicaid Other | Source: Ambulatory Visit | Attending: Obstetrics & Gynecology | Admitting: Obstetrics & Gynecology

## 2014-06-25 DIAGNOSIS — A599 Trichomoniasis, unspecified: Secondary | ICD-10-CM

## 2014-06-25 DIAGNOSIS — Z8619 Personal history of other infectious and parasitic diseases: Secondary | ICD-10-CM | POA: Insufficient documentation

## 2014-06-25 DIAGNOSIS — Z09 Encounter for follow-up examination after completed treatment for conditions other than malignant neoplasm: Secondary | ICD-10-CM | POA: Insufficient documentation

## 2014-06-25 HISTORY — DX: Obesity, unspecified: E66.9

## 2014-06-25 LAB — URINALYSIS, ROUTINE W REFLEX MICROSCOPIC
Bilirubin Urine: NEGATIVE
Glucose, UA: NEGATIVE mg/dL
Hgb urine dipstick: NEGATIVE
Ketones, ur: NEGATIVE mg/dL
LEUKOCYTES UA: NEGATIVE
NITRITE: NEGATIVE
PH: 7.5 (ref 5.0–8.0)
Protein, ur: NEGATIVE mg/dL
Specific Gravity, Urine: 1.015 (ref 1.005–1.030)
Urobilinogen, UA: 0.2 mg/dL (ref 0.0–1.0)

## 2014-06-25 LAB — HIV ANTIBODY (ROUTINE TESTING W REFLEX): HIV 1&2 Ab, 4th Generation: NONREACTIVE

## 2014-06-25 LAB — WET PREP, GENITAL
Trich, Wet Prep: NONE SEEN
WBC WET PREP: NONE SEEN
Yeast Wet Prep HPF POC: NONE SEEN

## 2014-06-25 LAB — POCT PREGNANCY, URINE: Preg Test, Ur: NEGATIVE

## 2014-06-25 LAB — RPR

## 2014-06-25 NOTE — MAU Provider Note (Signed)
History     CSN: [id number]  Arrival date and time: 06/25/14 1406   None     Chief Complaint  Patient presents with  . STD recheck    HPI  Pt is not pregnant and presents for follow up of TOC for trichomonas that was treated 06/15/2014 in MAU. Pt had neg GC/chlamydia. Pt denies vaginal discharge, itching or burning today.  Past Medical History  Diagnosis Date  . Medical history non-contributory     Past Surgical History  Procedure Laterality Date  . No past surgeries      No family history on file.  History  Substance Use Topics  . Smoking status: Never Smoker   . Smokeless tobacco: Never Used  . Alcohol Use: No    Allergies: No Known Allergies  Prescriptions prior to admission  Medication Sig Dispense Refill Last Dose  . omeprazole (PRILOSEC) 20 MG capsule Take 1 po BID x 2 weeks then once a day (Patient not taking: Reported on 06/15/2014) 60 capsule 0     ROS Physical Exam   Blood pressure 125/66, pulse 66, temperature 99 F (37.2 C), temperature source Oral, resp. rate 18, height 5\' 7"  (1.702 m), weight 289 lb 8 oz (131.316 kg), last menstrual period 06/07/2014.  Physical Exam  Nursing note and vitals reviewed. Constitutional: She is oriented to person, place, and time. She appears well-developed and well-nourished. No distress.  HENT:  Head: Normocephalic.  Eyes: Pupils are equal, round, and reactive to light.  Neck: Normal range of motion. Neck supple.  Cardiovascular: Normal rate.   Respiratory: Effort normal.  GI: Soft.  Genitourinary:  Small amount of watery white discharge inv autl; c ervix clean, NT; uterus mobile NSSC NT; adnexa without palpable enlargement or tenderness  Musculoskeletal: Normal range of motion.  Neurological: She is alert and oriented to person, place, and time.  Skin: Skin is warm and dry.  Psychiatric: She has a normal mood and affect.    MAU Course  Procedures Results for orders placed or performed during the  hospital encounter of 06/25/14 (from the past 24 hour(s))  Urinalysis, Routine w reflex microscopic     Status: Abnormal   Collection Time: 06/25/14  2:50 PM  Result Value Ref Range   Color, Urine YELLOW YELLOW   APPearance HAZY (A) CLEAR   Specific Gravity, Urine 1.015 1.005 - 1.030   pH 7.5 5.0 - 8.0   Glucose, UA NEGATIVE NEGATIVE mg/dL   Hgb urine dipstick NEGATIVE NEGATIVE   Bilirubin Urine NEGATIVE NEGATIVE   Ketones, ur NEGATIVE NEGATIVE mg/dL   Protein, ur NEGATIVE NEGATIVE mg/dL   Urobilinogen, UA 0.2 0.0 - 1.0 mg/dL   Nitrite NEGATIVE NEGATIVE   Leukocytes, UA NEGATIVE NEGATIVE  Wet prep, genital     Status: Abnormal   Collection Time: 06/25/14  5:05 PM  Result Value Ref Range   Yeast Wet Prep HPF POC NONE SEEN NONE SEEN   Trich, Wet Prep NONE SEEN NONE SEEN   Clue Cells Wet Prep HPF POC FEW (A) NONE SEEN   WBC, Wet Prep HPF POC NONE SEEN NONE SEEN  Pregnancy, urine POC     Status: None   Collection Time: 06/25/14  5:07 PM  Result Value Ref Range   Preg Test, Ur NEGATIVE NEGATIVE   Results for orders placed or performed during the hospital encounter of 06/25/14 (from the past 24 hour(s))  Urinalysis, Routine w reflex microscopic     Status: Abnormal   Collection Time: 06/25/14  2:50 PM  Result Value Ref Range   Color, Urine YELLOW YELLOW   APPearance HAZY (A) CLEAR   Specific Gravity, Urine 1.015 1.005 - 1.030   pH 7.5 5.0 - 8.0   Glucose, UA NEGATIVE NEGATIVE mg/dL   Hgb urine dipstick NEGATIVE NEGATIVE   Bilirubin Urine NEGATIVE NEGATIVE   Ketones, ur NEGATIVE NEGATIVE mg/dL   Protein, ur NEGATIVE NEGATIVE mg/dL   Urobilinogen, UA 0.2 0.0 - 1.0 mg/dL   Nitrite NEGATIVE NEGATIVE   Leukocytes, UA NEGATIVE NEGATIVE  HIV antibody     Status: None   Collection Time: 06/25/14  4:17 PM  Result Value Ref Range   HIV 1&2 Ab, 4th Generation NONREACTIVE NONREACTIVE  RPR     Status: None   Collection Time: 06/25/14  4:17 PM  Result Value Ref Range   RPR NON REAC NON  REAC  Wet prep, genital     Status: Abnormal   Collection Time: 06/25/14  5:05 PM  Result Value Ref Range   Yeast Wet Prep HPF POC NONE SEEN NONE SEEN   Trich, Wet Prep NONE SEEN NONE SEEN   Clue Cells Wet Prep HPF POC FEW (A) NONE SEEN   WBC, Wet Prep HPF POC NONE SEEN NONE SEEN  Pregnancy, urine POC     Status: None   Collection Time: 06/25/14  5:07 PM  Result Value Ref Range   Preg Test, Ur NEGATIVE NEGATIVE   Pt does not want contraception- states she will not be sexually active Assessment and Plan  Trich- clear Discussed contraception Edith Groleau 06/25/2014, 4:24 PM

## 2014-06-25 NOTE — Progress Notes (Signed)
Wet prep only collected and sent.  

## 2014-06-25 NOTE — MAU Note (Signed)
Pt states here for recheck of STD. Was told to make appt with care provider, however wanted to come here for re-eval. Denies bleeding nor abnormal vag d/c.

## 2014-08-08 ENCOUNTER — Encounter (HOSPITAL_COMMUNITY): Payer: Self-pay | Admitting: Emergency Medicine

## 2014-08-08 ENCOUNTER — Emergency Department (HOSPITAL_COMMUNITY): Payer: Medicaid Other

## 2014-08-08 ENCOUNTER — Emergency Department (HOSPITAL_COMMUNITY)
Admission: EM | Admit: 2014-08-08 | Discharge: 2014-08-08 | Disposition: A | Discharge status: Home / Self Care | Payer: Medicaid Other | Attending: Emergency Medicine | Admitting: Emergency Medicine

## 2014-08-08 DIAGNOSIS — E669 Obesity, unspecified: Secondary | ICD-10-CM | POA: Diagnosis not present

## 2014-08-08 DIAGNOSIS — X58XXXA Exposure to other specified factors, initial encounter: Secondary | ICD-10-CM | POA: Diagnosis not present

## 2014-08-08 DIAGNOSIS — Y998 Other external cause status: Secondary | ICD-10-CM | POA: Insufficient documentation

## 2014-08-08 DIAGNOSIS — Y9301 Activity, walking, marching and hiking: Secondary | ICD-10-CM | POA: Insufficient documentation

## 2014-08-08 DIAGNOSIS — S93401A Sprain of unspecified ligament of right ankle, initial encounter: Secondary | ICD-10-CM | POA: Diagnosis not present

## 2014-08-08 DIAGNOSIS — Y9289 Other specified places as the place of occurrence of the external cause: Secondary | ICD-10-CM | POA: Diagnosis not present

## 2014-08-08 DIAGNOSIS — S99911A Unspecified injury of right ankle, initial encounter: Secondary | ICD-10-CM | POA: Diagnosis present

## 2014-08-08 NOTE — ED Notes (Signed)
Initially injured right ankle in November. Has had several different episodes re-injuring that ankle. Pt ambulates without difficulty.

## 2014-08-08 NOTE — ED Provider Notes (Signed)
CSN: [id number]     Arrival date & time 08/08/14  1341 History  This chart was scribed for non-physician practitioner, Raymon MuttonMarissa Enslie Sahota, PA-C, working with Juliet RudeNathan R. Rubin PayorPickering, MD, by Ronney LionSuzanne Le, ED Scribe. This patient was seen in room TR06C/TR06C and the patient's care was started at 3:26 PM.      Chief Complaint  Patient presents with  . Ankle Pain   HPI   HPI Comments: Kim Hudson is a 24 y.o. female with a history of obesity who presents to the Emergency Department complaining of throbbing right ankle pain that began about 1.5 months ago, on December 9, when she rolled her ankle inward while walking. She reports she was able to ambulate, but noticed swelling the next day. Patient re-injured her ankle last week when she tripped over her niece's toy and fell forward on her knee. She denies head injury or LOC. Walking exacerbates the pain. She has tried an ankle brace with no relief. Patient has received no prior treatment for this problem. She denies a history of leg surgery. She denies numbness or knee pain.  [facility].   Past Medical History  Diagnosis Date  . Obese    Past Surgical History  Procedure Laterality Date  . No past surgeries     Family History  Problem Relation Age of Onset  . Hypertension Mother    History  Substance Use Topics  . Smoking status: Never Smoker   . Smokeless tobacco: Never Used  . Alcohol Use: No   OB History    Gravida Para Term Preterm AB TAB SAB Ectopic Multiple Living   0 0 0 0 0 0 0 0 0 0      Review of Systems  Musculoskeletal: Positive for arthralgias (Right ankle pain).  Neurological: Negative for weakness and numbness.      Allergies  Review of patient's allergies indicates no known allergies.  Home Medications   Prior to Admission medications   Medication Sig Start Date End Date Taking? Authorizing Provider  omeprazole (PRILOSEC) 20 MG capsule Take 1 po BID x 2 weeks then once a  day Patient not taking: Reported on 06/25/2014 04/26/14   Ward GivensIva L Knapp, MD   BP 129/59 mmHg  Pulse 63  Temp(Src) 97.8 F (36.6 C) (Oral)  Resp 16  Ht 5\' 7"  (1.702 m)  SpO2 99%  LMP 07/13/2014 (Approximate) Physical Exam  Constitutional: She is oriented to person, place, and time. She appears well-developed and well-nourished. No distress.  HENT:  Head: Normocephalic and atraumatic.  Eyes: Conjunctivae and EOM are normal. Right eye exhibits no discharge. Left eye exhibits no discharge.  Neck: Normal range of motion. Neck supple.  Cardiovascular: Normal rate, regular rhythm and normal heart sounds.  Exam reveals no friction rub.   No murmur heard. Pulses:      Radial pulses are 2+ on the right side, and 2+ on the left side.       Dorsalis pedis pulses are 2+ on the right side, and 2+ on the left side.  Cap refill less than 3 seconds  Pulmonary/Chest: Effort normal and breath sounds normal. No respiratory distress. She has no wheezes. She has no rales.  Musculoskeletal: Normal range of motion. She exhibits tenderness. She exhibits no edema.       Right ankle: She exhibits swelling. She exhibits normal range of motion, no ecchymosis, no deformity and no laceration. Tenderness. Lateral malleolus and AITFL tenderness found.  Feet:  Moderate swelling identified to the lateral aspect of the right ankle with negative ecchymosis, erythema, inflammation, lesions, sores, deformities, malalignment identified. Negative open wounds are punctures noted. Full range of motion to the digits the right foot without difficulty and full range of motion to the right ankle without difficulty. Negative crepitus upon palpation. Negative signs of hematoma.  Neurological: She is alert and oriented to person, place, and time. No cranial nerve deficit. She exhibits normal muscle tone. Coordination normal.  Skin: Skin is warm and dry. No rash noted. She is not diaphoretic. No erythema.  Psychiatric: She has a  normal mood and affect. Her behavior is normal. Thought content normal.  Nursing note and vitals reviewed.   ED Course  Procedures (including critical care time)  DIAGNOSTIC STUDIES: Oxygen Saturation is 96% on room air, normal by my interpretation.    COORDINATION OF CARE: 3:31 PM - Discussed treatment plan with pt at bedside which includes ankle brace, RICE protocol, and f/u with orthopedics, and pt agreed to plan.   Labs Review Labs Reviewed - No data to display  Imaging Review Dg Ankle Complete Right  08/08/2014   CLINICAL DATA:  Acute right ankle pain after tripping over a toy week ago. Initial encounter.  EXAM: RIGHT ANKLE - COMPLETE 3+ VIEW  COMPARISON:  None.  FINDINGS: There is no evidence of fracture, dislocation, or joint effusion. There is no evidence of arthropathy or other focal bone abnormality. Soft tissues are unremarkable.  IMPRESSION: Normal right ankle.   Electronically Signed   By: Roque Lias M.D.   On: 08/08/2014 14:57     EKG Interpretation None      MDM   Final diagnoses:  Right ankle sprain, initial encounter   Medications - No data to display   Filed Vitals:   08/08/14 1355 08/08/14 1536  BP: 139/85 129/59  Pulse: 110 63  Temp: 98.3 F (36.8 C) 97.8 F (36.6 C)  TempSrc: Oral Oral  Resp: 18 16  Height:  (1.702 m)   SpO2: 96% 99%    I personally performed the services described in this documentation, which was scribed in my presence. The recorded information has been reviewed and is accurate.  Plain film of right ankle negative for acute osseous injury. Negative signs of ischemia. Pulses palp on strong. Sensation intact. Negative focal neurological deficits noted. Full range of motion noted. Suspicion to be ankle sprain. Patient placed in ankle brace for comfort purposes. Patient stable, afebrile. Patient septic appearing. Discharged patient. [facility] and [facility]. Discussed with patient to rest,  ice, elevate. Discussed with patient to closely monitor symptoms and if symptoms are to worsen or change to report back to the ED - strict return instructions given.  Patient agreed to plan of care, understood, all questions answered.   Raymon Mutton, PA-C 08/08/14 1540  Juliet Rude. Rubin Payor, MD 08/09/14 [phone]

## 2014-08-08 NOTE — Discharge Instructions (Signed)
Please call your doctor for a followup appointment within 24-48 hours. When you talk to your doctor please let them know that you were seen in the emergency department and have them acquire all of your records so that they can discuss the findings with you and formulate a treatment plan to fully care for your new and ongoing problems. Please rest, ice, elevate Toe should be above nose been elevating Please keep ankle brace on at all times especially when walking Please avoid any physical or strenuous activity Please follow-up with orthopedics Please continue to monitor symptoms closely and if symptoms are to worsen or change (fever greater than 101, chills, sweating, nausea, vomiting, chest pain, shortness of breathe, difficulty breathing, weakness, numbness, tingling, worsening or changes to pain pattern, fall, injury, changes to skin colored, weakness, numbness, tingling, knee pain, back pain) please report back to the Emergency Department immediately.    Acute Ankle Sprain with Phase I Rehab An acute ankle sprain is a partial or complete tear in one or more of the ligaments of the ankle due to traumatic injury. The severity of the injury depends on both the number of ligaments sprained and the grade of sprain. There are 3 grades of sprains.   A grade 1 sprain is a mild sprain. There is a slight pull without obvious tearing. There is no loss of strength, and the muscle and ligament are the correct length.  A grade 2 sprain is a moderate sprain. There is tearing of fibers within the substance of the ligament where it connects two bones or two cartilages. The length of the ligament is increased, and there is usually decreased strength.  A grade 3 sprain is a complete rupture of the ligament and is uncommon. In addition to the grade of sprain, there are three types of ankle sprains.  Lateral ankle sprains: This is a sprain of one or more of the three ligaments on the outer side (lateral) of the  ankle. These are the most common sprains. Medial ankle sprains: There is one large triangular ligament of the inner side (medial) of the ankle that is susceptible to injury. Medial ankle sprains are less common. Syndesmosis, "high ankle," sprains: The syndesmosis is the ligament that connects the two bones of the lower leg. Syndesmosis sprains usually only occur with very severe ankle sprains. SYMPTOMS  Pain, tenderness, and swelling in the ankle, starting at the side of injury that may progress to the whole ankle and foot with time.  "Pop" or tearing sensation at the time of injury.  Bruising that may spread to the heel.  Impaired ability to walk soon after injury. CAUSES   Acute ankle sprains are caused by trauma placed on the ankle that temporarily forces or pries the anklebone (talus) out of its normal socket.  Stretching or tearing of the ligaments that normally hold the joint in place (usually due to a twisting injury). RISK INCREASES WITH:  Previous ankle sprain.  Sports in which the foot may land awkwardly (i.e., basketball, volleyball, or soccer) or walking or running on uneven or rough surfaces.  Shoes with inadequate support to prevent sideways motion when stress occurs.  Poor strength and flexibility.  Poor balance skills.  Contact sports. PREVENTION   Warm up and stretch properly before activity.  Maintain physical fitness:  Ankle and leg flexibility, muscle strength, and endurance.  Cardiovascular fitness.  Balance training activities.  Use proper technique and have a coach correct improper technique.  Taping, protective strapping, bracing, or  high-top tennis shoes may help prevent injury. Initially, tape is best; however, it loses most of its support function within 10 to 15 minutes.  Wear proper-fitted protective shoes (High-top shoes with taping or bracing is more effective than either alone).  Provide the ankle with support during sports and practice  activities for 12 months following injury. PROGNOSIS   If treated properly, ankle sprains can be expected to recover completely; however, the length of recovery depends on the degree of injury.  A grade 1 sprain usually heals enough in 5 to 7 days to allow modified activity and requires an average of 6 weeks to heal completely.  A grade 2 sprain requires 6 to 10 weeks to heal completely.  A grade 3 sprain requires 12 to 16 weeks to heal.  A syndesmosis sprain often takes more than 3 months to heal. RELATED COMPLICATIONS   Frequent recurrence of symptoms may result in a chronic problem. Appropriately addressing the problem the first time decreases the frequency of recurrence and optimizes healing time. Severity of the initial sprain does not predict the likelihood of later instability.  Injury to other structures (bone, cartilage, or tendon).  A chronically unstable or arthritic ankle joint is a possibility with repeated sprains. TREATMENT Treatment initially involves the use of ice, medication, and compression bandages to help reduce pain and inflammation. Ankle sprains are usually immobilized in a walking cast or boot to allow for healing. Crutches may be recommended to reduce pressure on the injury. After immobilization, strengthening and stretching exercises may be necessary to regain strength and a full range of motion. Surgery is rarely needed to treat ankle sprains. MEDICATION   Nonsteroidal anti-inflammatory medications, such as aspirin and ibuprofen (do not take for the first 3 days after injury or within 7 days before surgery), or other minor pain relievers, such as acetaminophen, are often recommended. Take these as directed by your caregiver. Contact your caregiver immediately if any bleeding, stomach upset, or signs of an allergic reaction occur from these medications.  Ointments applied to the skin may be helpful.  Pain relievers may be prescribed as necessary by your  caregiver. Do not take prescription pain medication for longer than 4 to 7 days. Use only as directed and only as much as you need. HEAT AND COLD  Cold treatment (icing) is used to relieve pain and reduce inflammation for acute and chronic cases. Cold should be applied for 10 to 15 minutes every 2 to 3 hours for inflammation and pain and immediately after any activity that aggravates your symptoms. Use ice packs or an ice massage.  Heat treatment may be used before performing stretching and strengthening activities prescribed by your caregiver. Use a heat pack or a warm soak. SEEK IMMEDIATE MEDICAL CARE IF:   Pain, swelling, or bruising worsens despite treatment.  You experience pain, numbness, discoloration, or coldness in the foot or toes.  New, unexplained symptoms develop (drugs used in treatment may produce side effects.) EXERCISES  PHASE I EXERCISES RANGE OF MOTION (ROM) AND STRETCHING EXERCISES - Ankle Sprain, Acute Phase I, Weeks 1 to 2 These exercises may help you when beginning to restore flexibility in your ankle. You will likely work on these exercises for the 1 to 2 weeks after your injury. Once your physician, physical therapist, or athletic trainer sees adequate progress, he or she will advance your exercises. While completing these exercises, remember:   Restoring tissue flexibility helps normal motion to return to the joints. This allows healthier, less  painful movement and activity.  An effective stretch should be held for at least 30 seconds.  A stretch should never be painful. You should only feel a gentle lengthening or release in the stretched tissue. RANGE OF MOTION - Dorsi/Plantar Flexion  While sitting with your right / left knee straight, draw the top of your foot upwards by flexing your ankle. Then reverse the motion, pointing your toes downward.  Hold each position for __________ seconds.  After completing your first set of exercises, repeat this exercise with  your knee bent. Repeat __________ times. Complete this exercise __________ times per day.  RANGE OF MOTION - Ankle Alphabet  Imagine your right / left big toe is a pen.  Keeping your hip and knee still, write out the entire alphabet with your "pen." Make the letters as large as you can without increasing any discomfort. Repeat __________ times. Complete this exercise __________ times per day.  STRENGTHENING EXERCISES - Ankle Sprain, Acute -Phase I, Weeks 1 to 2 These exercises may help you when beginning to restore strength in your ankle. You will likely work on these exercises for 1 to 2 weeks after your injury. Once your physician, physical therapist, or athletic trainer sees adequate progress, he or she will advance your exercises. While completing these exercises, remember:   Muscles can gain both the endurance and the strength needed for everyday activities through controlled exercises.  Complete these exercises as instructed by your physician, physical therapist, or athletic trainer. Progress the resistance and repetitions only as guided.  You may experience muscle soreness or fatigue, but the pain or discomfort you are trying to eliminate should never worsen during these exercises. If this pain does worsen, stop and make certain you are following the directions exactly. If the pain is still present after adjustments, discontinue the exercise until you can discuss the trouble with your clinician. STRENGTH - Dorsiflexors  Secure a rubber exercise band/tubing to a fixed object (i.e., table, pole) and loop the other end around your right / left foot.  Sit on the floor facing the fixed object. The band/tubing should be slightly tense when your foot is relaxed.  Slowly draw your foot back toward you using your ankle and toes.  Hold this position for __________ seconds. Slowly release the tension in the band and return your foot to the starting position. Repeat __________ times. Complete  this exercise __________ times per day.  STRENGTH - Plantar-flexors   Sit with your right / left leg extended. Holding onto both ends of a rubber exercise band/tubing, loop it around the ball of your foot. Keep a slight tension in the band.  Slowly push your toes away from you, pointing them downward.  Hold this position for __________ seconds. Return slowly, controlling the tension in the band/tubing. Repeat __________ times. Complete this exercise __________ times per day.  STRENGTH - Ankle Eversion  Secure one end of a rubber exercise band/tubing to a fixed object (table, pole). Loop the other end around your foot just before your toes.  Place your fists between your knees. This will focus your strengthening at your ankle.  Drawing the band/tubing across your opposite foot, slowly, pull your little toe out and up. Make sure the band/tubing is positioned to resist the entire motion.  Hold this position for __________ seconds. Have your muscles resist the band/tubing as it slowly pulls your foot back to the starting position.  Repeat __________ times. Complete this exercise __________ times per day.  STRENGTH -  Ankle Inversion  Secure one end of a rubber exercise band/tubing to a fixed object (table, pole). Loop the other end around your foot just before your toes.  Place your fists between your knees. This will focus your strengthening at your ankle.  Slowly, pull your big toe up and in, making sure the band/tubing is positioned to resist the entire motion.  Hold this position for __________ seconds.  Have your muscles resist the band/tubing as it slowly pulls your foot back to the starting position. Repeat __________ times. Complete this exercises __________ times per day.  STRENGTH - Towel Curls  Sit in a chair positioned on a non-carpeted surface.  Place your right / left foot on a towel, keeping your heel on the floor.  Pull the towel toward your heel by only curling your  toes. Keep your heel on the floor.  If instructed by your physician, physical therapist, or athletic trainer, add weight to the end of the towel. Repeat __________ times. Complete this exercise __________ times per day. Document Released: 01/27/2005 Document Revised: 11/12/2013 Document Reviewed: 10/10/2008 [facility] Patient Information 2015 [place], [place]. This information is not intended to replace advice given to you by your health care provider. Make sure you discuss any questions you have with your health care provider.   Emergency Department Resource Guide 1) Find a Doctor and Pay Out of Pocket Although you won't have to find out who is covered by your insurance plan, it is a good idea to ask around and get recommendations. You will then need to call the office and see if the doctor you have chosen will accept you as a new patient and what types of options they offer for patients who are self-pay. Some doctors offer discounts or will set up payment plans for their patients who do not have insurance, but you will need to ask so you aren't surprised when you get to your appointment.  2) Contact Your Local Health Department Not all health departments have doctors that can see patients for sick visits, but many do, so it is worth a call to see if yours does. If you don't know where your local health department is, you can check in your phone book. The CDC also has a tool to help you locate your state's health department, and many state websites also have listings of all of their local health departments.  3) Find a Walk-in Clinic If your illness is not likely to be very severe or complicated, you may want to try a walk in clinic. These are popping up all over the country in pharmacies, drugstores, and shopping centers. They're usually staffed by nurse practitioners or physician assistants that have been trained to treat common illnesses and complaints. They're usually fairly quick and inexpensive.  However, if you have serious medical issues or chronic medical problems, these are probably not your best option.  No Primary Care Doctor: - Call Health Connect at  [phone] - they can help you locate a primary care doctor that  accepts your insurance, provides certain services, etc. - Physician Referral Service- [phone]  Chronic Pain Problems: Organization         Address  Phone   Notes  [facility]  [phone] Patients need to be referred by their primary care doctor.   Medication Assistance: Organization         Address  Phone   Notes  [facility] Medication Assistance Program [address] [place] [place],  [place] [address] [phone] --Must be a resident of [facility] -- Must have NO insurance coverage whatsoever (no Medicaid/ Medicare, etc.) -- The pt. MUST have a primary care doctor that directs their care regularly and follows them in the community   [facility]  [phone]   [employer]  [phone]    Agencies that provide inexpensive medical care: Organization         Address  Phone   Notes  [facility]  [phone]   [facility]    [phone]   [facility] [address] [place], [place] [address] [phone]   [facility] [address] [place], [place] [phone]   Planned Parenthood    [phone]   [facility]    [phone]   [facility]  [address], [place] Phone:  [phone], Fax:  [phone] Hours of Operation:  9 am - 6 pm, M-F.  Also accepts Medicaid/Medicare and self-pay.  [facility]  [address], [place] Phone: [phone], Fax: [phone]. Hours of Operation:  8:30 am - 5:30 pm, M-F.  Also accepts Medicaid and self-pay.  [facility] [place] [address] [place] Phone: [phone]   [facility] [address] Natasha Bence [place], [place] [phone], Ext. [phone] Mondays & Thursdays: 7-9 AM.  First 15 patients are seen on a first come, first serve basis.    Medicaid-accepting [facility] Providers:  Organization         Address  Phone   Notes  [facility] [address], [place] [phone] Also accepts self-pay patients.  [facility] [address] Laurell Josephs [place], [place]  [phone]   [facility] [address], [place] [phone]   [facility] [address] [place] [phone]   Renaye Rakers [address], [place]   [phone] Only accepts [place] patients after they have their name applied to their card.   Self-Pay (no insurance) in [facility]:  Organization         Address  Phone   Notes  Sickle Cell Patients, [facility] [address] [place], [place] [phone]   [facility] [address] [place], [place] [phone]   [facility] [place]  [address], [place] [phone]   [facility]/Dr. Osei-Bonsu  [address] [place] or [address], [place] [phone] Phone number for both [place] and [place] locations is the same.  [facility] [address], [place] [phone]   [facility] [address] [place] or [address] [phone]   [facility] [address], [place] [phone], phone; [phone], fax Sees patients 1st and 3rd Saturday of every month.  Must not qualify for public or private insurance (i.e. Medicaid, Medicare, [place] Health Choice, Veterans' Benefits)  Household income should be no more than 200% of the poverty level The clinic cannot treat you if you are pregnant or think  you are pregnant  Sexually transmitted diseases are not treated at the clinic.    Dental Care: Organization  Address  Phone  Notes  [facility] [employer], [place] [phone] Accepts children up to age 62 who are enrolled in [place] or [place]; pregnant women with a Medicaid card; and children who have applied for Medicaid or [place] Health Choice, but were declined, whose parents can pay a reduced fee at time of service.  [facility] Department of [facility]  [address], [place] [phone] Accepts children up to age 64 who are enrolled in [place] or [place]; pregnant women with a Medicaid card; and children who have applied for Medicaid or [place] Health Choice, but were declined, whose parents can pay a reduced fee at time of service.  [place] [facility]  [place] [phone] Patients are seen by appointment only. Walk-ins are not accepted. [place] will see patients 54 years of age and older. Monday - Tuesday (8am-5pm) Most Wednesdays (8:30-5pm) $30 per visit, cash only  [facility]  [address], [place] [phone] Patients are seen by appointment only. Walk-ins are not accepted. [place] will see patients 44 years of age and older. One Wednesday Evening (Monthly: Volunteer Based).  $30 per visit, cash only  [place]  [phone] for adults; Children under age 1, call Graduate Pediatric Dentistry at [phone]. Children aged 45-14, please call [phone] to request a pediatric application.  Dental services are provided in all areas of dental care including fillings, crowns and bridges, complete and partial dentures, implants, gum treatment, root canals, and extractions. Preventive care is also provided. Treatment is provided to both adults and  children. Patients are selected via a lottery and there is often a waiting list.   [facility] [address], [place]  [phone] [email or web address]   [facility] [address] [place], [place] [phone], Ext. [phone] Second and Fourth Thursday of each month, opens at 6:30 AM; Clinic ends at 9 AM.  Patients are seen on a first-come first-served basis, and a limited number are seen during each clinic.   [facility]  [address] Hillard Danker [place], [place] [phone]   Eligibility Requirements You must have lived in [place], [place], or [place] counties for at least the last three months.   You cannot be eligible for state or federal sponsored [employer], including [employer], [place], or [employer].   You generally cannot be eligible for healthcare insurance through your employer.    How to apply: Eligibility screenings are held every Tuesday and Wednesday afternoon from 1:00 pm until 4:00 pm. You do not need an appointment for the interview!  [facility] [address], [place], [place]   [place]  [place] Department  [place]  [phone]    Behavioral Health Resources in the Community: Intensive Outpatient Programs Organization         Address  Phone  Notes  Idanha Ganado. [address], [place], [place] [phone]   [facility] [address] [place], [place]   [facility] [address] [place], [place]   [place] [address]  [place], [place] or [phone]   Substance Abuse Resources Organization  Address  Phone  Notes  [facility]  [phone]   [facility]  [phone]   [facility]  [phone]     [facility]  [phone]   Residential & Outpatient Substance Abuse Program  [phone]   Psychological Services Organization         Address  Phone  Notes  [facility]  [phone]- [phone]   [facility]  [phone]- [phone]   [facility] [address], [place] [phone] or [phone]    Mobile Crisis Teams Organization         Address  Phone  Notes  [facility], Mobile Crisis Care Unit  [phone]   [facility]  [address] [place], [place] [phone]   Doristine LocksSharon DeEsch [address] [place] [phone]    Self-Help/Support Groups Organization         Address  Phone             Notes  [facility] [place] - variety of support groups  [phone] Call for more information  Narcotics Anonymous (NA), [facility] [address], [place]  2 meetings at this location   Statisticianesidential Treatment Programs Organization         Address  Phone  Notes  [facility] [address],    [place]  [phone]   [facility]  [address], [place] [phone], [place], [place] [phone]   [facility] [address], [place] [phone] Admissions: 8am-3pm M-F  [facility] [address]    [place], [place] [phone]   [facility] [address], [place], [place] [phone]   [facility] [address]  [place], [place] [phone]   [facility] - Intensive Outpatient [address], [place], [place] [phone]   [facility] ([facility] [address]  [place], [place] [phone] or [phone]   [facility] [address] [place], [place] [phone] Accepts Medicaid  [facility] [address]  [place] [phone] Substance Abuse/Addiction Treatment   [facility]  Behavioral Health Resources Organization         Address  Phone  Notes  [facility]  [phone]) [phone]   Angie FavaJulie Brannon, PhD [address], [address] [place], [place]   [phone]) [phone] or [phone]   [facility]   [address] [place], [place] [phone]   [facility] [address], [place], [place] [phone] Insurance/Medicaid/sponsorship through [facility] [address]                                    [place], [place] [phone] Therapy/tele-psych/case  [facility] [address]   [place], [place] [phone]) [phone]    Dr. Lolly MustacheArfeen  [phone]   [facility]  [employer] [facility] 1) [address], [place] 2) [address], [place] 3)  [address], [place] [phone]  [phone]) [phone]   [facility] Child Abuse Hotline [phone] or [phone]) [phone] (After Hours)

## 2015-05-05 ENCOUNTER — Emergency Department (INDEPENDENT_AMBULATORY_CARE_PROVIDER_SITE_OTHER)
Admission: EM | Admit: 2015-05-05 | Discharge: 2015-05-05 | Disposition: A | Discharge status: Home / Self Care | Payer: Medicaid Other | Source: Home / Self Care | Attending: Family Medicine | Admitting: Family Medicine

## 2015-05-05 ENCOUNTER — Encounter (HOSPITAL_COMMUNITY): Payer: Self-pay | Admitting: Emergency Medicine

## 2015-05-05 ENCOUNTER — Other Ambulatory Visit (HOSPITAL_COMMUNITY)
Admission: RE | Admit: 2015-05-05 | Discharge: 2015-05-05 | Disposition: A | Discharge status: Home / Self Care | Payer: Medicaid Other | Source: Ambulatory Visit | Attending: Family Medicine | Admitting: Family Medicine

## 2015-05-05 DIAGNOSIS — B9689 Other specified bacterial agents as the cause of diseases classified elsewhere: Secondary | ICD-10-CM

## 2015-05-05 DIAGNOSIS — N898 Other specified noninflammatory disorders of vagina: Secondary | ICD-10-CM | POA: Diagnosis not present

## 2015-05-05 DIAGNOSIS — Z202 Contact with and (suspected) exposure to infections with a predominantly sexual mode of transmission: Secondary | ICD-10-CM

## 2015-05-05 DIAGNOSIS — N760 Acute vaginitis: Secondary | ICD-10-CM

## 2015-05-05 DIAGNOSIS — A499 Bacterial infection, unspecified: Secondary | ICD-10-CM

## 2015-05-05 DIAGNOSIS — Z113 Encounter for screening for infections with a predominantly sexual mode of transmission: Secondary | ICD-10-CM | POA: Insufficient documentation

## 2015-05-05 LAB — POCT PREGNANCY, URINE: Preg Test, Ur: NEGATIVE

## 2015-05-05 MED ORDER — METRONIDAZOLE 500 MG PO TABS: 500.0000 mg | ORAL_TABLET | Freq: Two times a day (BID) | ORAL | Status: DC

## 2015-05-05 NOTE — ED Notes (Signed)
Patient reports having had unprotected sex and concerned for std.  Patient reports vaginal discharge

## 2015-05-05 NOTE — Discharge Instructions (Signed)
Bacterial Vaginosis Bacterial vaginosis is a vaginal infection that occurs when the normal balance of bacteria in the vagina is disrupted. It results from an overgrowth of certain bacteria. This is the most common vaginal infection in women of childbearing age. Treatment is important to prevent complications, especially in pregnant women, as it can cause a premature delivery. CAUSES  Bacterial vaginosis is caused by an increase in harmful bacteria that are normally present in smaller amounts in the vagina. Several different kinds of bacteria can cause bacterial vaginosis. However, the reason that the condition develops is not fully understood. RISK FACTORS Certain activities or behaviors can put you at an increased risk of developing bacterial vaginosis, including:  Having a new sex partner or multiple sex partners.  Douching.  Using an intrauterine device (IUD) for contraception. Women do not get bacterial vaginosis from toilet seats, bedding, swimming pools, or contact with objects around them. SIGNS AND SYMPTOMS  Some women with bacterial vaginosis have no signs or symptoms. Common symptoms include:  Grey vaginal discharge.  A fishlike odor with discharge, especially after sexual intercourse.  Itching or burning of the vagina and vulva.  Burning or pain with urination. DIAGNOSIS  Your health care provider will take a medical history and examine the vagina for signs of bacterial vaginosis. A sample of vaginal fluid may be taken. Your health care provider will look at this sample under a microscope to check for bacteria and abnormal cells. A vaginal pH test may also be done.  TREATMENT  Bacterial vaginosis may be treated with antibiotic medicines. These may be given in the form of a pill or a vaginal cream. A second round of antibiotics may be prescribed if the condition comes back after treatment. Because bacterial vaginosis increases your risk for sexually transmitted diseases, getting  treated can help reduce your risk for chlamydia, gonorrhea, HIV, and herpes. HOME CARE INSTRUCTIONS   Only take over-the-counter or prescription medicines as directed by your health care provider.  If antibiotic medicine was prescribed, take it as directed. Make sure you finish it even if you start to feel better.  Tell all sexual partners that you have a vaginal infection. They should see their health care provider and be treated if they have problems, such as a mild rash or itching.  During treatment, it is important that you follow these instructions:  Avoid sexual activity or use condoms correctly.  Do not douche.  Avoid alcohol as directed by your health care provider.  Avoid breastfeeding as directed by your health care provider. SEEK MEDICAL CARE IF:   Your symptoms are not improving after 3 days of treatment.  You have increased discharge or pain.  You have a fever. MAKE SURE YOU:   Understand these instructions.  Will watch your condition.  Will get help right away if you are not doing well or get worse. FOR MORE INFORMATION  Centers for Disease Control and Prevention, Division of STD Prevention: [employer] American Sexual Health Association (ASHA): www.ashastd.org    This information is not intended to replace advice given to you by your health care provider. Make sure you discuss any questions you have with your health care provider.   Document Released: 06/28/2005 Document Revised: 07/19/2014 Document Reviewed: 02/07/2013 Elsevier Interactive Patient Education 2016 [place] Sex Safe sex is about reducing the risk of giving or getting a sexually transmitted disease (STD). STDs are spread through sexual contact involving the genitals, mouth, or rectum. Some STDs can be cured and  others cannot. Safe sex can also prevent unintended pregnancies.  WHAT ARE SOME SAFE SEX PRACTICES?  Limit your sexual activity to only one partner who is having sex  with only you.  Talk to your partner about his or her past partners, past STDs, and drug use.  Use a condom every time you have sexual intercourse. This includes vaginal, oral, and anal sexual activity. Both females and males should wear condoms during oral sex. Only use latex or polyurethane condoms and water-based lubricants. Using petroleum-based lubricants or oils to lubricate a condom will weaken the condom and increase the chance that it will break. The condom should be in place from the beginning to the end of sexual activity. Wearing a condom reduces, but does not completely eliminate, your risk of getting or giving an STD. STDs can be spread by contact with infected body fluids and skin.  Get vaccinated for hepatitis B and HPV.  Avoid alcohol and recreational drugs, which can affect your judgment. You may forget to use a condom or participate in high-risk sex.  For females, avoid douching after sexual intercourse. Douching can spread an infection farther into the reproductive tract.  Check your body for signs of sores, blisters, rashes, or unusual discharge. See your health care provider if you notice any of these signs.  Avoid sexual contact if you have symptoms of an infection or are being treated for an STD. If you or your partner has herpes, avoid sexual contact when blisters are present. Use condoms at all other times.  If you are at risk of being infected with HIV, it is recommended that you take a prescription medicine daily to prevent HIV infection. This is called pre-exposure prophylaxis (PrEP). You are considered at risk if:  You are a man who has sex with other men (MSM).  You are a heterosexual man or woman who is sexually active with more than one partner.  You take drugs by injection.  You are sexually active with a partner who has HIV.  Talk with your health care provider about whether you are at high risk of being infected with HIV. If you choose to begin PrEP, you  should first be tested for HIV. You should then be tested every 3 months for as long as you are taking PrEP.  See your health care provider for regular screenings, exams, and tests for other STDs. Before having sex with a new partner, each of you should be screened for STDs and should talk about the results with each other. WHAT ARE THE BENEFITS OF SAFE SEX?   There is less chance of getting or giving an STD.  You can prevent unwanted or unintended pregnancies.  By discussing safe sex concerns with your partner, you may increase feelings of intimacy, comfort, trust, and honesty between the two of you.   This information is not intended to replace advice given to you by your health care provider. Make sure you discuss any questions you have with your health care provider.   Document Released: 08/05/2004 Document Revised: 07/19/2014 Document Reviewed: 12/20/2011 Elsevier Interactive Patient Education 2016 [employer].  Sexually Transmitted Disease A sexually transmitted disease (STD) is a disease or infection that may be passed (transmitted) from person to person, usually during sexual activity. This may happen by way of saliva, semen, blood, vaginal mucus, or urine. Common STDs include:  Gonorrhea.  Chlamydia.  Syphilis.  HIV and AIDS.  Genital herpes.  Hepatitis B and C.  Trichomonas.  Human papillomavirus (  HPV).  Pubic lice.  Scabies.  Mites.  Bacterial vaginosis. WHAT ARE CAUSES OF STDs? An STD may be caused by bacteria, a virus, or parasites. STDs are often transmitted during sexual activity if one person is infected. However, they may also be transmitted through nonsexual means. STDs may be transmitted after:   Sexual intercourse with an infected person.  Sharing sex toys with an infected person.  Sharing needles with an infected person or using unclean piercing or tattoo needles.  Having intimate contact with the genitals, mouth, or rectal areas of an  infected person.  Exposure to infected fluids during birth. WHAT ARE THE SIGNS AND SYMPTOMS OF STDs? Different STDs have different symptoms. Some people may not have any symptoms. If symptoms are present, they may include:  Painful or bloody urination.  Pain in the pelvis, abdomen, vagina, anus, throat, or eyes.  A skin rash, itching, or irritation.  Growths, ulcerations, blisters, or sores in the genital and anal areas.  Abnormal vaginal discharge with or without bad odor.  Penile discharge in men.  Fever.  Pain or bleeding during sexual intercourse.  Swollen glands in the groin area.  Yellow skin and eyes (jaundice). This is seen with hepatitis.  Swollen testicles.  Infertility.  Sores and blisters in the mouth. HOW ARE STDs DIAGNOSED? To make a diagnosis, your health care provider may:  Take a medical history.  Perform a physical exam.  Take a sample of any discharge to examine.  Swab the throat, cervix, opening to the penis, rectum, or vagina for testing.  Test a sample of your first morning urine.  Perform blood tests.  Perform a Pap test, if this applies.  Perform a colposcopy.  Perform a laparoscopy. HOW ARE STDs TREATED? Treatment depends on the STD. Some STDs may be treated but not cured.  Chlamydia, gonorrhea, trichomonas, and syphilis can be cured with antibiotic medicine.  Genital herpes, hepatitis, and HIV can be treated, but not cured, with prescribed medicines. The medicines lessen symptoms.  Genital warts from HPV can be treated with medicine or by freezing, burning (electrocautery), or surgery. Warts may come back.  HPV cannot be cured with medicine or surgery. However, abnormal areas may be removed from the cervix, vagina, or vulva.  If your diagnosis is confirmed, your recent sexual partners need treatment. This is true even if they are symptom-free or have a negative culture or evaluation. They should not have sex until their health  care providers say it is okay.  Your health care provider may test you for infection again 3 months after treatment. HOW CAN I REDUCE MY RISK OF GETTING AN STD? Take these steps to reduce your risk of getting an STD:  Use latex condoms, dental dams, and water-soluble lubricants during sexual activity. Do not use petroleum jelly or oils.  Avoid having multiple sex partners.  Do not have sex with someone who has other sex partners  Do not have sex with anyone you do not know or who is at high risk for an STD.  Avoid risky sex practices that can break your skin.  Do not have sex if you have open sores on your mouth or skin.  Avoid drinking too much alcohol or taking illegal drugs. Alcohol and drugs can affect your judgment and put you in a vulnerable position.  Avoid engaging in oral and anal sex acts.  Get vaccinated for HPV and hepatitis. If you have not received these vaccines in the past, talk to your health  care provider about whether one or both might be right for you.  If you are at risk of being infected with HIV, it is recommended that you take a prescription medicine daily to prevent HIV infection. This is called pre-exposure prophylaxis (PrEP). You are considered at risk if:  You are a man who has sex with other men (MSM).  You are a heterosexual man or woman and are sexually active with more than one partner.  You take drugs by injection.  You are sexually active with a partner who has HIV.  Talk with your health care provider about whether you are at high risk of being infected with HIV. If you choose to begin PrEP, you should first be tested for HIV. You should then be tested every 3 months for as long as you are taking PrEP. WHAT SHOULD I DO IF I THINK I HAVE AN STD?  See your health care provider.  Tell your sexual partner(s). They should be tested and treated for any STDs.  Do not have sex until your health care provider says it is okay. WHEN SHOULD I GET  IMMEDIATE MEDICAL CARE? Contact your health care provider right away if:   You have severe abdominal pain.  You are a man and notice swelling or pain in your testicles.  You are a woman and notice swelling or pain in your vagina.   This information is not intended to replace advice given to you by your health care provider. Make sure you discuss any questions you have with your health care provider.   Document Released: 09/18/2002 Document Revised: 07/19/2014 Document Reviewed: 01/16/2013 Elsevier Interactive Patient Education 2016 [employer].  Vaginitis Vaginitis is an inflammation of the vagina. It is most often caused by a change in the normal balance of the bacteria and yeast that live in the vagina. This change in balance causes an overgrowth of certain bacteria or yeast, which causes the inflammation. There are different types of vaginitis, but the most common types are:  Bacterial vaginosis.  Yeast infection (candidiasis).  Trichomoniasis vaginitis. This is a sexually transmitted infection (STI).  Viral vaginitis.  Atrophic vaginitis.  Allergic vaginitis. CAUSES  The cause depends on the type of vaginitis. Vaginitis can be caused by:  Bacteria (bacterial vaginosis).  Yeast (yeast infection).  A parasite (trichomoniasis vaginitis)  A virus (viral vaginitis).  Low hormone levels (atrophic vaginitis). Low hormone levels can occur during pregnancy, breastfeeding, or after menopause.  Irritants, such as bubble baths, scented tampons, and feminine sprays (allergic vaginitis). Other factors can change the normal balance of the yeast and bacteria that live in the vagina. These include:  Antibiotic medicines.  Poor hygiene.  Diaphragms, vaginal sponges, spermicides, birth control pills, and intrauterine devices (IUD).  Sexual intercourse.  Infection.  Uncontrolled diabetes.  A weakened immune system. SYMPTOMS  Symptoms can vary depending on the cause of the  vaginitis. Common symptoms include:  Abnormal vaginal discharge.  The discharge is white, gray, or yellow with bacterial vaginosis.  The discharge is thick, white, and cheesy with a yeast infection.  The discharge is frothy and yellow or greenish with trichomoniasis.  A bad vaginal odor.  The odor is fishy with bacterial vaginosis.  Vaginal itching, pain, or swelling.  Painful intercourse.  Pain or burning when urinating. Sometimes, there are no symptoms. TREATMENT  Treatment will vary depending on the type of infection.   Bacterial vaginosis and trichomoniasis are often treated with antibiotic creams or pills.  Yeast infections are  often treated with antifungal medicines, such as vaginal creams or suppositories.  Viral vaginitis has no cure, but symptoms can be treated with medicines that relieve discomfort. Your sexual partner should be treated as well.  Atrophic vaginitis may be treated with an estrogen cream, pill, suppository, or vaginal ring. If vaginal dryness occurs, lubricants and moisturizing creams may help. You may be told to avoid scented soaps, sprays, or douches.  Allergic vaginitis treatment involves quitting the use of the product that is causing the problem. Vaginal creams can be used to treat the symptoms. HOME CARE INSTRUCTIONS   Take all medicines as directed by your caregiver.  Keep your genital area clean and dry. Avoid soap and only rinse the area with water.  Avoid douching. It can remove the healthy bacteria in the vagina.  Do not use tampons or have sexual intercourse until your vaginitis has been treated. Use sanitary pads while you have vaginitis.  Wipe from front to back. This avoids the spread of bacteria from the rectum to the vagina.  Let air reach your genital area.  Wear cotton underwear to decrease moisture buildup.  Avoid wearing underwear while you sleep until your vaginitis is gone.  Avoid tight pants and underwear or nylons  without a cotton panel.  Take off wet clothing (especially bathing suits) as soon as possible.  Use mild, non-scented products. Avoid using irritants, such as:  Scented feminine sprays.  Fabric softeners.  Scented detergents.  Scented tampons.  Scented soaps or bubble baths.  Practice safe sex and use condoms. Condoms may prevent the spread of trichomoniasis and viral vaginitis. SEEK MEDICAL CARE IF:   You have abdominal pain.  You have a fever or persistent symptoms for more than 2-3 days.  You have a fever and your symptoms suddenly get worse.   This information is not intended to replace advice given to you by your health care provider. Make sure you discuss any questions you have with your health care provider.   Document Released: 04/25/2007 Document Revised: 11/12/2014 Document Reviewed: 12/09/2011 Elsevier Interactive Patient Education [employer] Elsevier Inc.

## 2015-05-05 NOTE — ED Provider Notes (Signed)
CSN: [id number]     Arrival date & time 05/05/15  1312 History   First MD Initiated Contact with Patient 05/05/15 1347     Chief Complaint  Patient presents with  . Vaginal Discharge   (Consider location/radiation/quality/duration/timing/severity/associated sxs/prior Treatment) HPI Comments: Obese 24 year old female stated that she had unprotected sex recently and wants to be checked for possible STDs. She believes she may have an abnormal vaginal discharge. She denies pelvic pain, bleeding or other symptoms.   Past Medical History  Diagnosis Date  . Obese    Past Surgical History  Procedure Laterality Date  . No past surgeries     Family History  Problem Relation Age of Onset  . Hypertension Mother    Social History  Substance Use Topics  . Smoking status: Never Smoker   . Smokeless tobacco: Never Used  . Alcohol Use: No   OB History    Gravida Para Term Preterm AB TAB SAB Ectopic Multiple Living   0 0 0 0 0 0 0 0 0 0      Review of Systems  Constitutional: Negative.   Respiratory: Negative.   Genitourinary: Positive for vaginal discharge. Negative for dysuria, flank pain, vaginal bleeding, vaginal pain and pelvic pain.  Musculoskeletal: Negative.   Skin: Negative.     Allergies  Review of patient's allergies indicates no known allergies.  Home Medications   Prior to Admission medications   Medication Sig Start Date End Date Taking? Authorizing Provider  metroNIDAZOLE (FLAGYL) 500 MG tablet Take 1 tablet (500 mg total) by mouth 2 (two) times daily. X 7 days 05/05/15   Hayden Rasmussenavid Clarence Dunsmore, NP   Meds Ordered and Administered this Visit  Medications - No data to display  BP 152/92 mmHg  Pulse 71  Temp(Src) 97.4 F (36.3 C) (Oral)  Resp 16  SpO2 97%  LMP 04/11/2015 No data found.   Physical Exam  Constitutional: She appears well-developed and well-nourished. No distress.  Eyes: EOM are normal.  Neck: Normal range of motion. Neck supple.  Cardiovascular: Normal  rate.   Pulmonary/Chest: Effort normal. No respiratory distress.  Genitourinary:  Normal external female genitalia Scant small amount of white thick vaginal discharge within the vaginal vault. The cervix is clear and small. Os nulliparous. Ectocervix pink and without lesions. No fluid exuding from the os. No cervical motion tenderness.  Musculoskeletal: She exhibits no edema.  Neurological: She is alert. She exhibits normal muscle tone. Coordination normal.  Skin: Skin is warm and dry.  Psychiatric: She has a normal mood and affect.  Nursing note and vitals reviewed.   ED Course  Procedures (including critical care time)  Labs Review Labs Reviewed  CERVICOVAGINAL ANCILLARY ONLY     Imaging Review No results found.   Visual Acuity Review  Right Eye Distance:   Left Eye Distance:   Bilateral Distance:    Right Eye Near:   Left Eye Near:    Bilateral Near:         MDM   1. Vaginal discharge   2. Possible exposure to STD   3. BV (bacterial vaginosis)    Flagyl as dir Info on safe sex and BV    Hayden Rasmussenavid Aahil Fredin, NP 05/05/15 1419

## 2015-05-06 LAB — CERVICOVAGINAL ANCILLARY ONLY
Chlamydia: POSITIVE — AB
Neisseria Gonorrhea: NEGATIVE

## 2015-05-06 NOTE — ED Notes (Addendum)
Final report of STD testing available for review. Vaginal swab positive for chlamydia, not treated. Discussed w D Mabe, who authorized 1 x dose of azithromycin. Called patient, and after verifying ID, discussed findings. Called Rx to [facility] at patient request, spoke directly w pharmacy staff. Patient was advised to inform her partner, so they could be treated as well , and so she could avoid reinfection. Patient also advised to practice safer sex. Patient verbalized understanding of plan. Form 2124 DHHS [facility].

## 2015-05-07 LAB — CERVICOVAGINAL ANCILLARY ONLY: WET PREP (BD AFFIRM): POSITIVE — AB

## 2015-07-09 ENCOUNTER — Encounter (HOSPITAL_COMMUNITY): Payer: Self-pay | Admitting: *Deleted

## 2015-07-09 ENCOUNTER — Inpatient Hospital Stay (HOSPITAL_COMMUNITY)
Admission: AD | Admit: 2015-07-09 | Discharge: 2015-07-10 | Disposition: A | Discharge status: Home / Self Care | Payer: Medicaid Other | Source: Ambulatory Visit | Attending: Obstetrics & Gynecology | Admitting: Obstetrics & Gynecology

## 2015-07-09 DIAGNOSIS — N898 Other specified noninflammatory disorders of vagina: Secondary | ICD-10-CM | POA: Insufficient documentation

## 2015-07-09 DIAGNOSIS — E669 Obesity, unspecified: Secondary | ICD-10-CM | POA: Insufficient documentation

## 2015-07-09 DIAGNOSIS — Z202 Contact with and (suspected) exposure to infections with a predominantly sexual mode of transmission: Secondary | ICD-10-CM | POA: Diagnosis not present

## 2015-07-09 DIAGNOSIS — B3731 Acute candidiasis of vulva and vagina: Secondary | ICD-10-CM

## 2015-07-09 DIAGNOSIS — B373 Candidiasis of vulva and vagina: Secondary | ICD-10-CM | POA: Insufficient documentation

## 2015-07-09 LAB — URINALYSIS, ROUTINE W REFLEX MICROSCOPIC
BILIRUBIN URINE: NEGATIVE
Glucose, UA: NEGATIVE mg/dL
Hgb urine dipstick: NEGATIVE
KETONES UR: NEGATIVE mg/dL
LEUKOCYTES UA: NEGATIVE
Nitrite: NEGATIVE
PROTEIN: NEGATIVE mg/dL
pH: 5.5 (ref 5.0–8.0)

## 2015-07-09 LAB — POCT PREGNANCY, URINE: Preg Test, Ur: NEGATIVE

## 2015-07-09 NOTE — MAU Provider Note (Signed)
History     CSN: [id number]  Arrival date and time: 07/09/15 2318   First Provider Initiated Contact with Patient 07/09/15 2351       Chief Complaint  Patient presents with  . Vaginal Discharge  . Exposure to STD   HPI  Kim Hudson is a 24 y.o. female who presents for vaginal discharge & STD testing.  States she found out today that her boyfriend has been cheating on her. Requesting STD testin.  Reports some thick white vaginal discharge.  Denies n/v/d/constipation, abdominal pain, or fever.  Denies dyspareunia or postcoital bleeding.    OB History    Gravida Para Term Preterm AB TAB SAB Ectopic Multiple Living   0 0 0 0 0 0 0 0 0 0       Past Medical History  Diagnosis Date  . Obese     Past Surgical History  Procedure Laterality Date  . No past surgeries      Family History  Problem Relation Age of Onset  . Hypertension Mother     Social History  Substance Use Topics  . Smoking status: Never Smoker   . Smokeless tobacco: Never Used  . Alcohol Use: No    Allergies: No Known Allergies  Prescriptions prior to admission  Medication Sig Dispense Refill Last Dose  . metroNIDAZOLE (FLAGYL) 500 MG tablet Take 1 tablet (500 mg total) by mouth 2 (two) times daily. X 7 days 14 tablet 0     Review of Systems  Constitutional: Negative.   Gastrointestinal: Negative.   Genitourinary:       + vaginal discharge   Physical Exam   Blood pressure 117/67, pulse 60, temperature 98.6 F (37 C), temperature source Oral, resp. rate 20, last menstrual period 07/07/2015, SpO2 98 %.  Physical Exam  Nursing note and vitals reviewed. Constitutional: She is oriented to person, place, and time. She appears well-developed and well-nourished. No distress.  HENT:  Head: Normocephalic and atraumatic.  Eyes: Conjunctivae are normal. Right eye exhibits no discharge. Left eye exhibits no discharge. No scleral icterus.  Neck: Normal range of motion.  Cardiovascular: Normal  rate, regular rhythm and normal heart sounds.   No murmur heard. Respiratory: Effort normal and breath sounds normal. No respiratory distress. She has no wheezes.  GI: Soft. Bowel sounds are normal. She exhibits no distension. There is no tenderness.  Genitourinary: Vagina normal. Cervix exhibits discharge (thick clumpy white discharge). Cervix exhibits no motion tenderness and no friability.  Neurological: She is alert and oriented to person, place, and time.  Skin: Skin is warm and dry. She is not diaphoretic.  Psychiatric: She has a normal mood and affect. Her behavior is normal. Judgment and thought content normal.    MAU Course  Procedures Results for orders placed or performed during the hospital encounter of 07/09/15 (from the past 24 hour(s))  Urinalysis, Routine w reflex microscopic (not at [facility])     Status: Abnormal   Collection Time: 07/09/15 11:31 PM  Result Value Ref Range   Color, Urine YELLOW YELLOW   APPearance CLEAR CLEAR   Specific Gravity, Urine >1.030 (H) 1.005 - 1.030   pH 5.5 5.0 - 8.0   Glucose, UA NEGATIVE NEGATIVE mg/dL   Hgb urine dipstick NEGATIVE NEGATIVE   Bilirubin Urine NEGATIVE NEGATIVE   Ketones, ur NEGATIVE NEGATIVE mg/dL   Protein, ur NEGATIVE NEGATIVE mg/dL   Nitrite NEGATIVE NEGATIVE   Leukocytes, UA NEGATIVE NEGATIVE  Pregnancy, urine POC     Status: None  Collection Time: 07/09/15 11:37 PM  Result Value Ref Range   Preg Test, Ur NEGATIVE NEGATIVE  Wet prep, genital     Status: None   Collection Time: 07/09/15 11:39 PM  Result Value Ref Range   Yeast Wet Prep HPF POC NONE SEEN NONE SEEN   Trich, Wet Prep NONE SEEN NONE SEEN   Clue Cells Wet Prep HPF POC NONE SEEN NONE SEEN   WBC, Wet Prep HPF POC NONE SEEN NONE SEEN   Sperm NONE SEEN   CBC     Status: None   Collection Time: 07/10/15 12:00 AM  Result Value Ref Range   WBC 9.1 4.0 - 10.5 K/uL   RBC 4.69 3.87 - 5.11 MIL/uL   Hemoglobin 12.3 12.0 - 15.0 g/dL   HCT 16.1 09.6 - 04.5 %    MCV 80.8 78.0 - 100.0 fL   MCH 26.2 26.0 - 34.0 pg   MCHC 32.5 30.0 - 36.0 g/dL   RDW 40.9 81.1 - 91.4 %   Platelets 273 150 - 400 K/uL    MDM UPT negative After exam, pt states that she had intercourse with partner immediately after being treated for gonorrhea. Will treat patient with rocephin & azithromycin in MAU.  Diflucan given prior to discharge for symptomatic yeast.   Assessment and Plan  A: 1. Yeast infection of the vagina   2. STD exposure     P: Discharge home GC/CT, HIV, RPR pending No intercourse x 1 week after both her & partner in treatment. Pt state she will no longer be in touch with ex partner. Encouraged her to contact him about needing retreatment.   Judeth Horn, NP  07/09/2015, 11:40 PM

## 2015-07-09 NOTE — MAU Note (Signed)
Pt reports she just found out that her sig other has been sleeping with someone else. Pt reports some vaginal discharge.

## 2015-07-10 LAB — WET PREP, GENITAL
Clue Cells Wet Prep HPF POC: NONE SEEN
SPERM: NONE SEEN
TRICH WET PREP: NONE SEEN
WBC WET PREP: NONE SEEN
YEAST WET PREP: NONE SEEN

## 2015-07-10 LAB — CBC
HEMATOCRIT: 37.9 % (ref 36.0–46.0)
Hemoglobin: 12.3 g/dL (ref 12.0–15.0)
MCH: 26.2 pg (ref 26.0–34.0)
MCHC: 32.5 g/dL (ref 30.0–36.0)
MCV: 80.8 fL (ref 78.0–100.0)
Platelets: 273 10*3/uL (ref 150–400)
RBC: 4.69 MIL/uL (ref 3.87–5.11)
RDW: 14.2 % (ref 11.5–15.5)
WBC: 9.1 10*3/uL (ref 4.0–10.5)

## 2015-07-10 LAB — HIV ANTIBODY (ROUTINE TESTING W REFLEX): HIV Screen 4th Generation wRfx: NONREACTIVE

## 2015-07-10 LAB — RPR: RPR Ser Ql: NONREACTIVE

## 2015-07-10 MED ORDER — AZITHROMYCIN 250 MG PO TABS
1000.0000 mg | ORAL_TABLET | Freq: Once | ORAL | Status: AC
  Administered 2015-07-10: 1000 mg via ORAL
  Filled 2015-07-10: qty 4

## 2015-07-10 MED ORDER — FLUCONAZOLE 150 MG PO TABS
150.0000 mg | ORAL_TABLET | Freq: Once | ORAL | Status: AC
  Administered 2015-07-10: 150 mg via ORAL
  Filled 2015-07-10: qty 1

## 2015-07-10 MED ORDER — CEFTRIAXONE SODIUM 250 MG IJ SOLR
250.0000 mg | Freq: Once | INTRAMUSCULAR | Status: AC
  Administered 2015-07-10: 250 mg via INTRAMUSCULAR
  Filled 2015-07-10: qty 250

## 2015-07-10 NOTE — Discharge Instructions (Signed)
Contraception Choices Contraception (birth control) is the use of any methods or devices to prevent pregnancy. Below are some methods to help avoid pregnancy. HORMONAL METHODS   Contraceptive implant. This is a thin, plastic tube containing progesterone hormone. It does not contain estrogen hormone. Your health care provider inserts the tube in the inner part of the upper arm. The tube can remain in place for up to 3 years. After 3 years, the implant must be removed. The implant prevents the ovaries from releasing an egg (ovulation), thickens the cervical mucus to prevent sperm from entering the uterus, and thins the lining of the inside of the uterus.  Progesterone-only injections. These injections are given every 3 months by your health care provider to prevent pregnancy. This synthetic progesterone hormone stops the ovaries from releasing eggs. It also thickens cervical mucus and changes the uterine lining. This makes it harder for sperm to survive in the uterus.  Birth control pills. These pills contain estrogen and progesterone hormone. They work by preventing the ovaries from releasing eggs (ovulation). They also cause the cervical mucus to thicken, preventing the sperm from entering the uterus. Birth control pills are prescribed by a health care provider.Birth control pills can also be used to treat heavy periods.  Minipill. This type of birth control pill contains only the progesterone hormone. They are taken every day of each month and must be prescribed by your health care provider.  Birth control patch. The patch contains hormones similar to those in birth control pills. It must be changed once a week and is prescribed by a health care provider.  Vaginal ring. The ring contains hormones similar to those in birth control pills. It is left in the vagina for 3 weeks, removed for 1 week, and then a new one is put back in place. The patient must be comfortable inserting and removing the ring  from the vagina.A health care provider's prescription is necessary.  Emergency contraception. Emergency contraceptives prevent pregnancy after unprotected sexual intercourse. This pill can be taken right after sex or up to 5 days after unprotected sex. It is most effective the sooner you take the pills after having sexual intercourse. Most emergency contraceptive pills are available without a prescription. Check with your pharmacist. Do not use emergency contraception as your only form of birth control. BARRIER METHODS   Female condom. This is a thin sheath (latex or rubber) that is worn over the penis during sexual intercourse. It can be used with spermicide to increase effectiveness.  Female condom. This is a soft, loose-fitting sheath that is put into the vagina before sexual intercourse.  Diaphragm. This is a soft, latex, dome-shaped barrier that must be fitted by a health care provider. It is inserted into the vagina, along with a spermicidal jelly. It is inserted before intercourse. The diaphragm should be left in the vagina for 6 to 8 hours after intercourse.  Cervical cap. This is a round, soft, latex or plastic cup that fits over the cervix and must be fitted by a health care provider. The cap can be left in place for up to 48 hours after intercourse.  Sponge. This is a soft, circular piece of polyurethane foam. The sponge has spermicide in it. It is inserted into the vagina after wetting it and before sexual intercourse.  Spermicides. These are chemicals that kill or block sperm from entering the cervix and uterus. They come in the form of creams, jellies, suppositories, foam, or tablets. They do not require a   prescription. They are inserted into the vagina with an applicator before having sexual intercourse. The process must be repeated every time you have sexual intercourse. INTRAUTERINE CONTRACEPTION  Intrauterine device (IUD). This is a T-shaped device that is put in a woman's uterus  during a menstrual period to prevent pregnancy. There are 2 types:  Copper IUD. This type of IUD is wrapped in copper wire and is placed inside the uterus. Copper makes the uterus and fallopian tubes produce a fluid that kills sperm. It can stay in place for 10 years.  Hormone IUD. This type of IUD contains the hormone progestin (synthetic progesterone). The hormone thickens the cervical mucus and prevents sperm from entering the uterus, and it also thins the uterine lining to prevent implantation of a fertilized egg. The hormone can weaken or kill the sperm that get into the uterus. It can stay in place for 3-5 years, depending on which type of IUD is used. PERMANENT METHODS OF CONTRACEPTION  Female tubal ligation. This is when the woman's fallopian tubes are surgically sealed, tied, or blocked to prevent the egg from traveling to the uterus.  Hysteroscopic sterilization. This involves placing a small coil or insert into each fallopian tube. Your doctor uses a technique called hysteroscopy to do the procedure. The device causes scar tissue to form. This results in permanent blockage of the fallopian tubes, so the sperm cannot fertilize the egg. It takes about 3 months after the procedure for the tubes to become blocked. You must use another form of birth control for these 3 months.  Female sterilization. This is when the female has the tubes that carry sperm tied off (vasectomy).This blocks sperm from entering the vagina during sexual intercourse. After the procedure, the man can still ejaculate fluid (semen). NATURAL PLANNING METHODS  Natural family planning. This is not having sexual intercourse or using a barrier method (condom, diaphragm, cervical cap) on days the woman could become pregnant.  Calendar method. This is keeping track of the length of each menstrual cycle and identifying when you are fertile.  Ovulation method. This is avoiding sexual intercourse during ovulation.  Symptothermal  method. This is avoiding sexual intercourse during ovulation, using a thermometer and ovulation symptoms.  Post-ovulation method. This is timing sexual intercourse after you have ovulated. Regardless of which type or method of contraception you choose, it is important that you use condoms to protect against the transmission of sexually transmitted infections (STIs). Talk with your health care provider about which form of contraception is most appropriate for you.   This information is not intended to replace advice given to you by your health care provider. Make sure you discuss any questions you have with your health care provider.   Document Released: 06/28/2005 Document Revised: 07/03/2013 Document Reviewed: 12/21/2012 Elsevier Interactive Patient Education 2016 Elsevier Inc.  

## 2015-07-12 LAB — GC/CHLAMYDIA PROBE AMP (~~LOC~~) NOT AT ARMC
Chlamydia: NEGATIVE
NEISSERIA GONORRHEA: NEGATIVE

## 2015-08-27 ENCOUNTER — Inpatient Hospital Stay (HOSPITAL_COMMUNITY)
Admission: AD | Admit: 2015-08-27 | Discharge: 2015-08-27 | Disposition: A | Discharge status: Home / Self Care | Payer: Medicaid Other | Source: Ambulatory Visit | Attending: Obstetrics & Gynecology | Admitting: Obstetrics & Gynecology

## 2015-08-27 DIAGNOSIS — M25579 Pain in unspecified ankle and joints of unspecified foot: Secondary | ICD-10-CM | POA: Diagnosis present

## 2015-08-27 DIAGNOSIS — M25572 Pain in left ankle and joints of left foot: Secondary | ICD-10-CM | POA: Diagnosis not present

## 2015-08-27 NOTE — MAU Note (Signed)
Pt has been having pain in left ankle for quite some time.  States she walks a lot and it really hurts. ( even gait, no limping or favoring noted)

## 2015-08-27 NOTE — MAU Provider Note (Signed)
Kim Hudson 25 y.o. G0P0000 nonpregnant female presents to MAU for ankle pain. It has been ongoing over one year.  She is able to bear weight and ambulate sufficiently.  She reports no loss of sensation or function.  She denies other complaint.   She is advised we are OB/GYN facility and this is no ob/gyn problem.  I have advised her of the several orthopedic clinics in the area and their locations.  She is encouraged to call them to schedule an appointment for care.  If she develops an orthopedic emergency, she is advised to go to [facility] or [facility] to receive care.   Return to MAU for emergent OB/GYN care.

## 2015-10-02 ENCOUNTER — Encounter (HOSPITAL_COMMUNITY): Payer: Self-pay | Admitting: *Deleted

## 2015-10-02 ENCOUNTER — Inpatient Hospital Stay (HOSPITAL_COMMUNITY)
Admission: AD | Admit: 2015-10-02 | Discharge: 2015-10-02 | Disposition: A | Discharge status: Home / Self Care | Payer: Medicaid Other | Source: Ambulatory Visit | Attending: Obstetrics and Gynecology | Admitting: Obstetrics and Gynecology

## 2015-10-02 DIAGNOSIS — Z3202 Encounter for pregnancy test, result negative: Secondary | ICD-10-CM | POA: Insufficient documentation

## 2015-10-02 DIAGNOSIS — Z202 Contact with and (suspected) exposure to infections with a predominantly sexual mode of transmission: Secondary | ICD-10-CM | POA: Diagnosis present

## 2015-10-02 DIAGNOSIS — N898 Other specified noninflammatory disorders of vagina: Secondary | ICD-10-CM | POA: Diagnosis not present

## 2015-10-02 DIAGNOSIS — K219 Gastro-esophageal reflux disease without esophagitis: Secondary | ICD-10-CM | POA: Diagnosis not present

## 2015-10-02 HISTORY — DX: Dorsalgia, unspecified: M54.9

## 2015-10-02 HISTORY — DX: Gastro-esophageal reflux disease without esophagitis: K21.9

## 2015-10-02 LAB — URINALYSIS, ROUTINE W REFLEX MICROSCOPIC
Bilirubin Urine: NEGATIVE
Glucose, UA: NEGATIVE mg/dL
Hgb urine dipstick: NEGATIVE
KETONES UR: NEGATIVE mg/dL
LEUKOCYTES UA: NEGATIVE
NITRITE: NEGATIVE
PH: 5.5 (ref 5.0–8.0)
Protein, ur: NEGATIVE mg/dL

## 2015-10-02 LAB — WET PREP, GENITAL
CLUE CELLS WET PREP: NONE SEEN
Sperm: NONE SEEN
TRICH WET PREP: NONE SEEN
YEAST WET PREP: NONE SEEN

## 2015-10-02 LAB — POCT PREGNANCY, URINE: PREG TEST UR: NEGATIVE

## 2015-10-02 NOTE — Discharge Instructions (Signed)
Sexually Transmitted Disease A sexually transmitted disease (STD) is a disease or infection often passed to another person during sex. However, STDs can be passed through nonsexual ways. An STD can be passed through:  Spit (saliva).  Semen.  Blood.  Mucus from the vagina.  Pee (urine). HOW CAN I LESSEN MY CHANCES OF GETTING AN STD?  Use:  Latex condoms.  Water-soluble lubricants with condoms. Do not use petroleum jelly or oils.  Dental dams. These are small pieces of latex that are used as a barrier during oral sex.  Avoid having more than one sex partner.  Do not have sex with someone who has other sex partners.  Do not have sex with anyone you do not know or who is at high risk for an STD.  Avoid risky sex that can break your skin.  Do not have sex if you have open sores on your mouth or skin.  Avoid drinking too much alcohol or taking illegal drugs. Alcohol and drugs can affect your good judgment.  Avoid oral and anal sex acts.  Get shots (vaccines) for HPV and hepatitis.  If you are at risk of being infected with HIV, it is advised that you take a certain medicine daily to prevent HIV infection. This is called pre-exposure prophylaxis (PrEP). You may be at risk if:  You are a man who has sex with other men (MSM).  You are attracted to the opposite sex (heterosexual) and are having sex with more than one partner.  You take drugs with a needle.  You have sex with someone who has HIV.  Talk with your doctor about if you are at high risk of being infected with HIV. If you begin to take PrEP, get tested for HIV first. Get tested every 3 months for as long as you are taking PrEP.  Get tested for STDs every year if you are sexually active. If you are treated for an STD, get tested again 3 months after you are treated. WHAT SHOULD I DO IF I THINK I HAVE AN STD?  See your doctor.  Tell your sex partner(s) that you have an STD. They should be tested and treated.  Do  not have sex until your doctor says it is okay. WHEN SHOULD I GET HELP? Get help right away if:  You have bad belly (abdominal) pain.  You are a man and have puffiness (swelling) or pain in your testicles.  You are a woman and have puffiness in your vagina.   This information is not intended to replace advice given to you by your health care provider. Make sure you discuss any questions you have with your health care provider.   Document Released: 08/05/2004 Document Revised: 07/19/2014 Document Reviewed: 12/22/2012 Elsevier Interactive Patient Education 2016 Elsevier Inc.  

## 2015-10-02 NOTE — MAU Provider Note (Signed)
History     CSN: [id number]  Arrival date and time: 10/02/15 1850   First Provider Initiated Contact with Patient 10/02/15 1932      Chief Complaint  Patient presents with  . Exposure to STD   HPI Ms. Kim Hudson is a 25 y.o. G0P0000 who presents to MAU today with complaint of need for STD check. The patient denies vaginal discharge, bleeding abdominal pain, UTI symptoms or fever. She has 2 recent partners and no known STD contacts.     OB History    Gravida Para Term Preterm AB TAB SAB Ectopic Multiple Living   0 0 0 0 0 0 0 0 0 0       Past Medical History  Diagnosis Date  . Obese   . Back pain   . GERD (gastroesophageal reflux disease)     Past Surgical History  Procedure Laterality Date  . No past surgeries      Family History  Problem Relation Age of Onset  . Hypertension Mother     Social History  Substance Use Topics  . Smoking status: Never Smoker   . Smokeless tobacco: Never Used  . Alcohol Use: No    Allergies: No Known Allergies  Prescriptions prior to admission  Medication Sig Dispense Refill Last Dose  . naproxen sodium (ANAPROX) 220 MG tablet Take 440 mg by mouth 2 (two) times daily as needed (pain).   Past Week at Unknown time    Review of Systems  Constitutional: Negative for fever and malaise/fatigue.  Gastrointestinal: Negative for nausea, vomiting, abdominal pain, diarrhea and constipation.  Genitourinary: Negative for dysuria, urgency and frequency.       Neg - vaginal bleeding, discharge   Physical Exam   Blood pressure 134/73, pulse 95, temperature 99.7 F (37.6 C), resp. rate 18, height 5\' 7"  (1.702 m), weight 329 lb (149.233 kg), last menstrual period 09/09/2015.  Physical Exam  Nursing note and vitals reviewed. Constitutional: She is oriented to person, place, and time. She appears well-developed and well-nourished. No distress.  HENT:  Head: Normocephalic and atraumatic.  Cardiovascular: Normal rate.   Respiratory:  Effort normal.  GI: Soft. She exhibits no distension and no mass. There is no tenderness. There is no rebound and no guarding.  Genitourinary: Uterus is not enlarged and not tender. Cervix exhibits no motion tenderness, no discharge and no friability. Right adnexum displays no mass and no tenderness. Left adnexum displays no mass and no tenderness. No bleeding in the vagina. No vaginal discharge found.  Neurological: She is alert and oriented to person, place, and time.  Skin: Skin is warm and dry. No erythema.  Psychiatric: She has a normal mood and affect.     Results for orders placed or performed during the hospital encounter of 10/02/15 (from the past 24 hour(s))  Urinalysis, Routine w reflex microscopic (not at [facility])     Status: Abnormal   Collection Time: 10/02/15  7:15 PM  Result Value Ref Range   Color, Urine STRAW (A) YELLOW   APPearance CLEAR CLEAR   Specific Gravity, Urine <1.005 (L) 1.005 - 1.030   pH 5.5 5.0 - 8.0   Glucose, UA NEGATIVE NEGATIVE mg/dL   Hgb urine dipstick NEGATIVE NEGATIVE   Bilirubin Urine NEGATIVE NEGATIVE   Ketones, ur NEGATIVE NEGATIVE mg/dL   Protein, ur NEGATIVE NEGATIVE mg/dL   Nitrite NEGATIVE NEGATIVE   Leukocytes, UA NEGATIVE NEGATIVE  Wet prep, genital     Status: Abnormal   Collection Time: 10/02/15  7:30 PM  Result Value Ref Range   Yeast Wet Prep HPF POC NONE SEEN NONE SEEN   Trich, Wet Prep NONE SEEN NONE SEEN   Clue Cells Wet Prep HPF POC NONE SEEN NONE SEEN   WBC, Wet Prep HPF POC MODERATE (A) NONE SEEN   Sperm NONE SEEN   Pregnancy, urine POC     Status: None   Collection Time: 10/02/15  7:33 PM  Result Value Ref Range   Preg Test, Ur NEGATIVE NEGATIVE     MAU Course  Procedures None  MDM UPT  UA, wet prep, GC/Chlamydia, HIV and RPR today  Assessment and Plan  A: STD check  P: Discharge home Patient advised to follow-up with PCP or GYN of choice GC/Chlamydia, HIV and RPR pending at time of discharge Patient may  return to MAU as needed or if her condition were to change or worsen   Marny Lowenstein, PA-C  10/02/2015, 8:08 PM

## 2015-10-02 NOTE — MAU Note (Signed)
Pt stated she has been having unprotected intercourse with 2 partners. Not having any symptoms but is worried. Wants to be tested.

## 2015-10-03 LAB — GC/CHLAMYDIA PROBE AMP (~~LOC~~) NOT AT ARMC
CHLAMYDIA, DNA PROBE: NEGATIVE
NEISSERIA GONORRHEA: NEGATIVE

## 2015-10-03 LAB — HIV ANTIBODY (ROUTINE TESTING W REFLEX): HIV SCREEN 4TH GENERATION: NONREACTIVE

## 2015-10-03 LAB — RPR: RPR: NONREACTIVE

## 2015-10-16 ENCOUNTER — Emergency Department (HOSPITAL_COMMUNITY): Payer: Medicaid Other

## 2015-10-16 ENCOUNTER — Emergency Department (HOSPITAL_COMMUNITY)
Admission: EM | Admit: 2015-10-16 | Discharge: 2015-10-16 | Disposition: A | Discharge status: Home / Self Care | Payer: Medicaid Other | Attending: Emergency Medicine | Admitting: Emergency Medicine

## 2015-10-16 DIAGNOSIS — M79672 Pain in left foot: Secondary | ICD-10-CM | POA: Insufficient documentation

## 2015-10-16 DIAGNOSIS — Z8719 Personal history of other diseases of the digestive system: Secondary | ICD-10-CM | POA: Insufficient documentation

## 2015-10-16 DIAGNOSIS — E669 Obesity, unspecified: Secondary | ICD-10-CM | POA: Diagnosis not present

## 2015-10-16 DIAGNOSIS — G8929 Other chronic pain: Secondary | ICD-10-CM

## 2015-10-16 MED ORDER — ACETAMINOPHEN 500 MG PO TABS: 500.0000 mg | ORAL_TABLET | Freq: Four times a day (QID) | ORAL | Status: DC | PRN

## 2015-10-16 NOTE — Discharge Instructions (Signed)
Your foot pain is likely related to a bone spur in your heel.  Increase weight reduction, exercise, gentle massage and wearing shoe with good cushion can be helpful.  Take tylenol for pain.   Musculoskeletal Pain Musculoskeletal pain is muscle and boney aches and pains. These pains can occur in any part of the body. Your caregiver may treat you without knowing the cause of the pain. They may treat you if blood or urine tests, X-rays, and other tests were normal.  CAUSES There is often not a definite cause or reason for these pains. These pains may be caused by a type of germ (virus). The discomfort may also come from overuse. Overuse includes working out too hard when your body is not fit. Boney aches also come from weather changes. Bone is sensitive to atmospheric pressure changes. HOME CARE INSTRUCTIONS   Ask when your test results will be ready. Make sure you get your test results.  Only take over-the-counter or prescription medicines for pain, discomfort, or fever as directed by your caregiver. If you were given medications for your condition, do not drive, operate machinery or power tools, or sign legal documents for 24 hours. Do not drink alcohol. Do not take sleeping pills or other medications that may interfere with treatment.  Continue all activities unless the activities cause more pain. When the pain lessens, slowly resume normal activities. Gradually increase the intensity and duration of the activities or exercise.  During periods of severe pain, bed rest may be helpful. Lay or sit in any position that is comfortable.  Putting ice on the injured area.  Put ice in a bag.  Place a towel between your skin and the bag.  Leave the ice on for 15 to 20 minutes, 3 to 4 times a day.  Follow up with your caregiver for continued problems and no reason can be found for the pain. If the pain becomes worse or does not go away, it may be necessary to repeat tests or do additional testing. Your  caregiver may need to look further for a possible cause. SEEK IMMEDIATE MEDICAL CARE IF:  You have pain that is getting worse and is not relieved by medications.  You develop chest pain that is associated with shortness or breath, sweating, feeling sick to your stomach (nauseous), or throw up (vomit).  Your pain becomes localized to the abdomen.  You develop any new symptoms that seem different or that concern you. MAKE SURE YOU:   Understand these instructions.  Will watch your condition.  Will get help right away if you are not doing well or get worse.   This information is not intended to replace advice given to you by your health care provider. Make sure you discuss any questions you have with your health care provider.   Document Released: 06/28/2005 Document Revised: 09/20/2011 Document Reviewed: 03/02/2013 Elsevier Interactive Patient Education [employer] Elsevier Inc.

## 2015-10-16 NOTE — ED Notes (Signed)
C/o left heel pain x 1 year.

## 2015-10-16 NOTE — ED Provider Notes (Signed)
CSN: [id number]     Arrival date & time 10/16/15  0746 History   None    No chief complaint on file.    (Consider location/radiation/quality/duration/timing/severity/associated sxs/prior Treatment) HPI   25 year old obese female with history of back pain presents complaining of left foot pain. Patient developed pain to her for the year ago and has Progressively Worse Recently. She Reported Pain Is Worsened with Ambulation but Denies Any Specific Injury to the Affected Area. She described pain as a throbbing sensation worsening with an ablation and currently rated as severe 10 when not ambulating and 10 out of 10 when ambulating. She denies any pain to her left ankle or knee. No history of gout. No numbness or weakness.  Past Medical History  Diagnosis Date  . Obese   . Back pain   . GERD (gastroesophageal reflux disease)    Past Surgical History  Procedure Laterality Date  . No past surgeries     Family History  Problem Relation Age of Onset  . Hypertension Mother    Social History  Substance Use Topics  . Smoking status: Never Smoker   . Smokeless tobacco: Never Used  . Alcohol Use: No   OB History    Gravida Para Term Preterm AB TAB SAB Ectopic Multiple Living       Review of Systems  Constitutional: Negative for fever.  Musculoskeletal: Positive for arthralgias.  Skin: Negative for rash and wound.  Neurological: Negative for numbness.      Allergies  Review of patient's allergies indicates no known allergies.  Home Medications   Prior to Admission medications   Medication Sig Start Date End Date Taking? Authorizing Provider  naproxen sodium (ANAPROX) 220 MG tablet Take 440 mg by mouth 2 (two) times daily as needed (pain).    Historical Provider, MD   BP 140/78 mmHg  Pulse 72  Temp(Src) 97.9 F (36.6 C) (Oral)  Resp 18  Ht  (1.702 m)  Wt 152.494 kg  BMI 52.64 kg/m2  SpO2 100%  LMP 09/08/2015 Physical Exam  Constitutional: She  appears well-developed and well-nourished. No distress.  HENT:  Head: Atraumatic.  Eyes: Conjunctivae are normal.  Neck: Neck supple.  Musculoskeletal: She exhibits tenderness (Left foot: Tenderness noted to sole of foot near the heel region without any overlying skin changes or swelling noted. No deformity. Dorsalis pedis pulses 2+ with brisk cap refill. Left ankle with full range of motion and no tenderness.).  Neurological: She is alert.  Skin: No rash noted.  Psychiatric: She has a normal mood and affect.  Nursing note and vitals reviewed.   ED Course  Procedures (including critical care time) Labs Review Labs Reviewed - No data to display  Imaging Review Dg Foot Complete Left  10/16/2015  CLINICAL DATA:  Chronic heel pain with no injury, worse today. Initial encounter. EXAM: LEFT FOOT - COMPLETE 3+ VIEW COMPARISON:  None. FINDINGS: No acute or stress fracture. Normal foot alignment with no arthritic narrowing. Non eroded plantar heel spur which is small. IMPRESSION: Small non eroded heel spur. Electronically Signed   By: Marnee Spring M.D.   On: 10/16/2015 08:52   I have personally reviewed and evaluated these images and lab results as part of my medical decision-making.   EKG Interpretation None      MDM   Final diagnoses:  Pain, foot, chronic, left    BP 140/78 mmHg  Pulse 72  Temp(Src) 97.9  F (36.6 C) (Oral)  Resp 18  Ht 5\' 7"  (1.702 m)  Wt 152.494 kg  BMI 52.64 kg/m2  SpO2 100%  LMP 09/08/2015   8:50 AM Patient here with left foot pain, no signs of infection. She is neurovascular intact. X-ray demonstrate a bone spur near the heel but no other concerning finding. I suspect her pain is likely secondary to the bone spur and also due to her obesity. I recommend weight reduction, wearing shoes with exertion, gentle massage, take Tylenol as needed for pain, and follow-up with orthopedist as needed.  Fayrene HelperBowie Tamakia Porto, PA-C 10/16/15 [id number]  Benjiman CoreNathan Pickering,  MD 10/17/15 [phone]

## 2015-10-16 NOTE — ED Notes (Signed)
Pt c/o L foot pain, onset x 1 yr with worsening recently, pain increases with ambulation, pt denies injury to the area, pt ambulatory, A&O x4

## 2016-01-04 IMAGING — CR DG ANKLE COMPLETE 3+V*R*
3 series · 3 of 3 positions shown · non-contrast
Comparison: None.

CLINICAL DATA: Acute right ankle pain after tripping over a toy
week ago. Initial encounter.

EXAM:
RIGHT ANKLE - COMPLETE 3+ VIEW

[ankle ap]
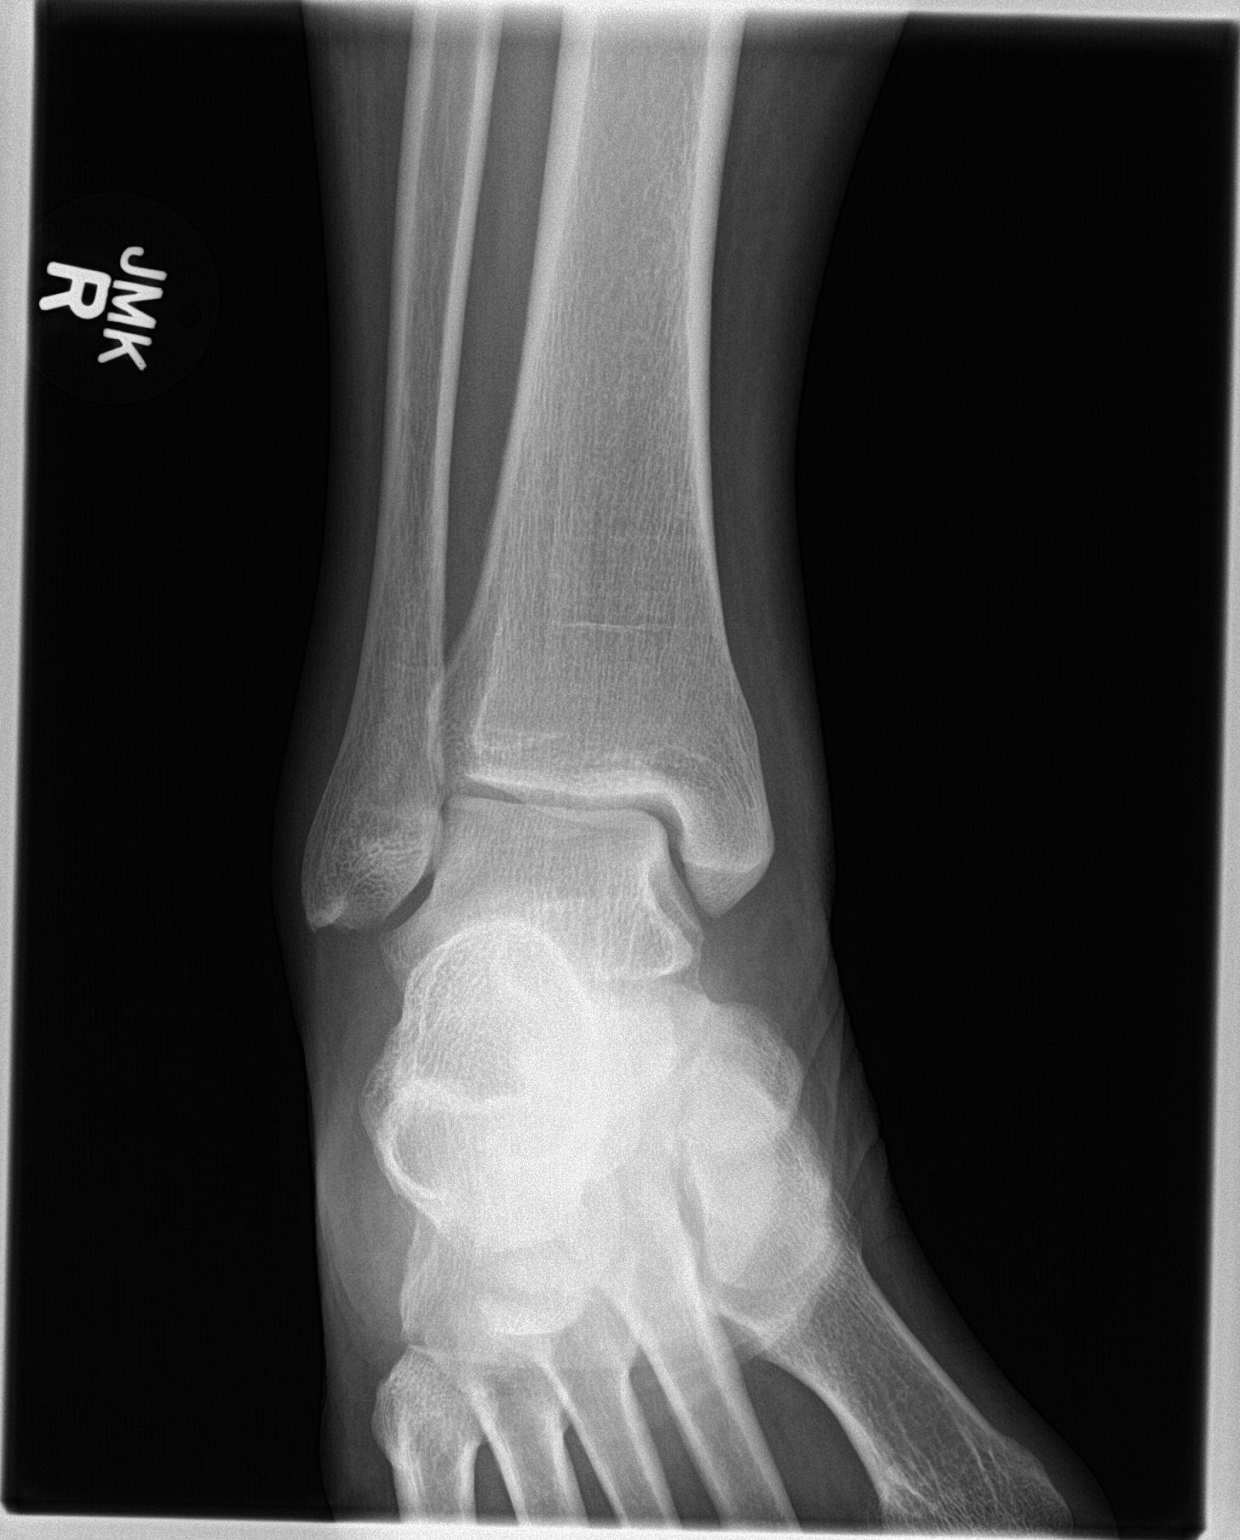

[ankle obl]
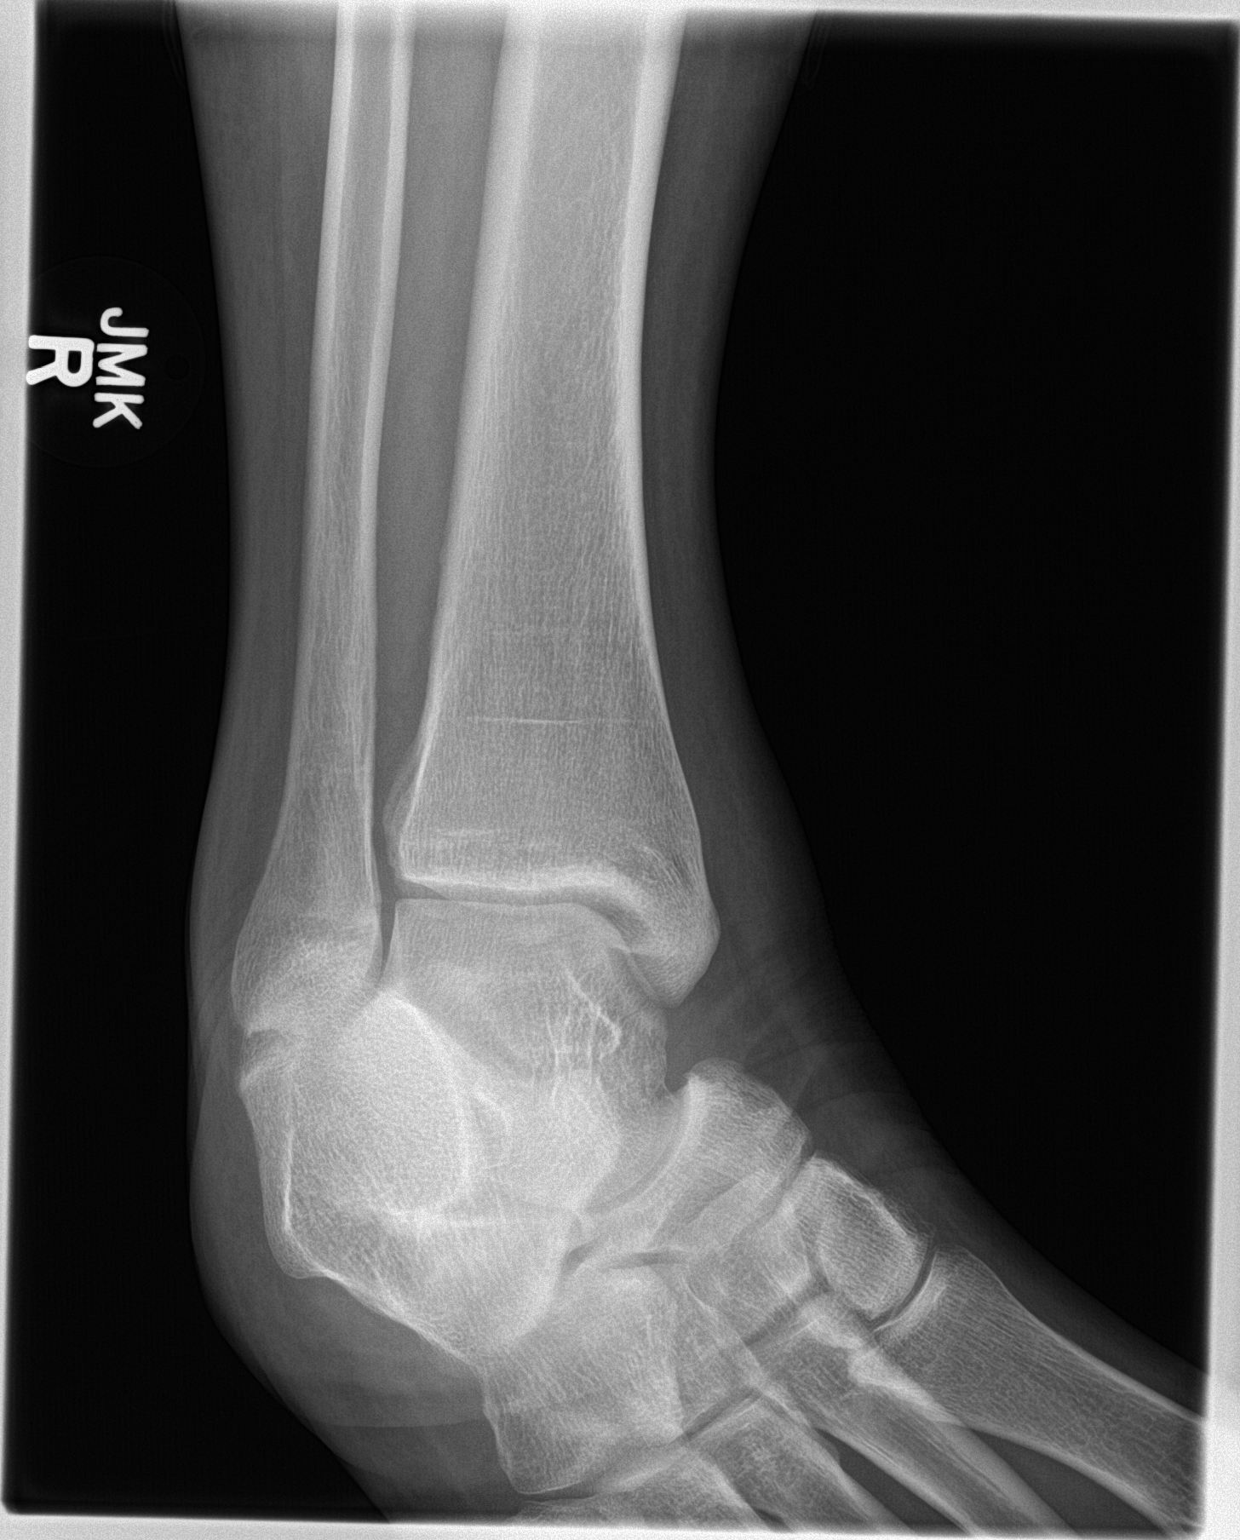

[ankle lat]
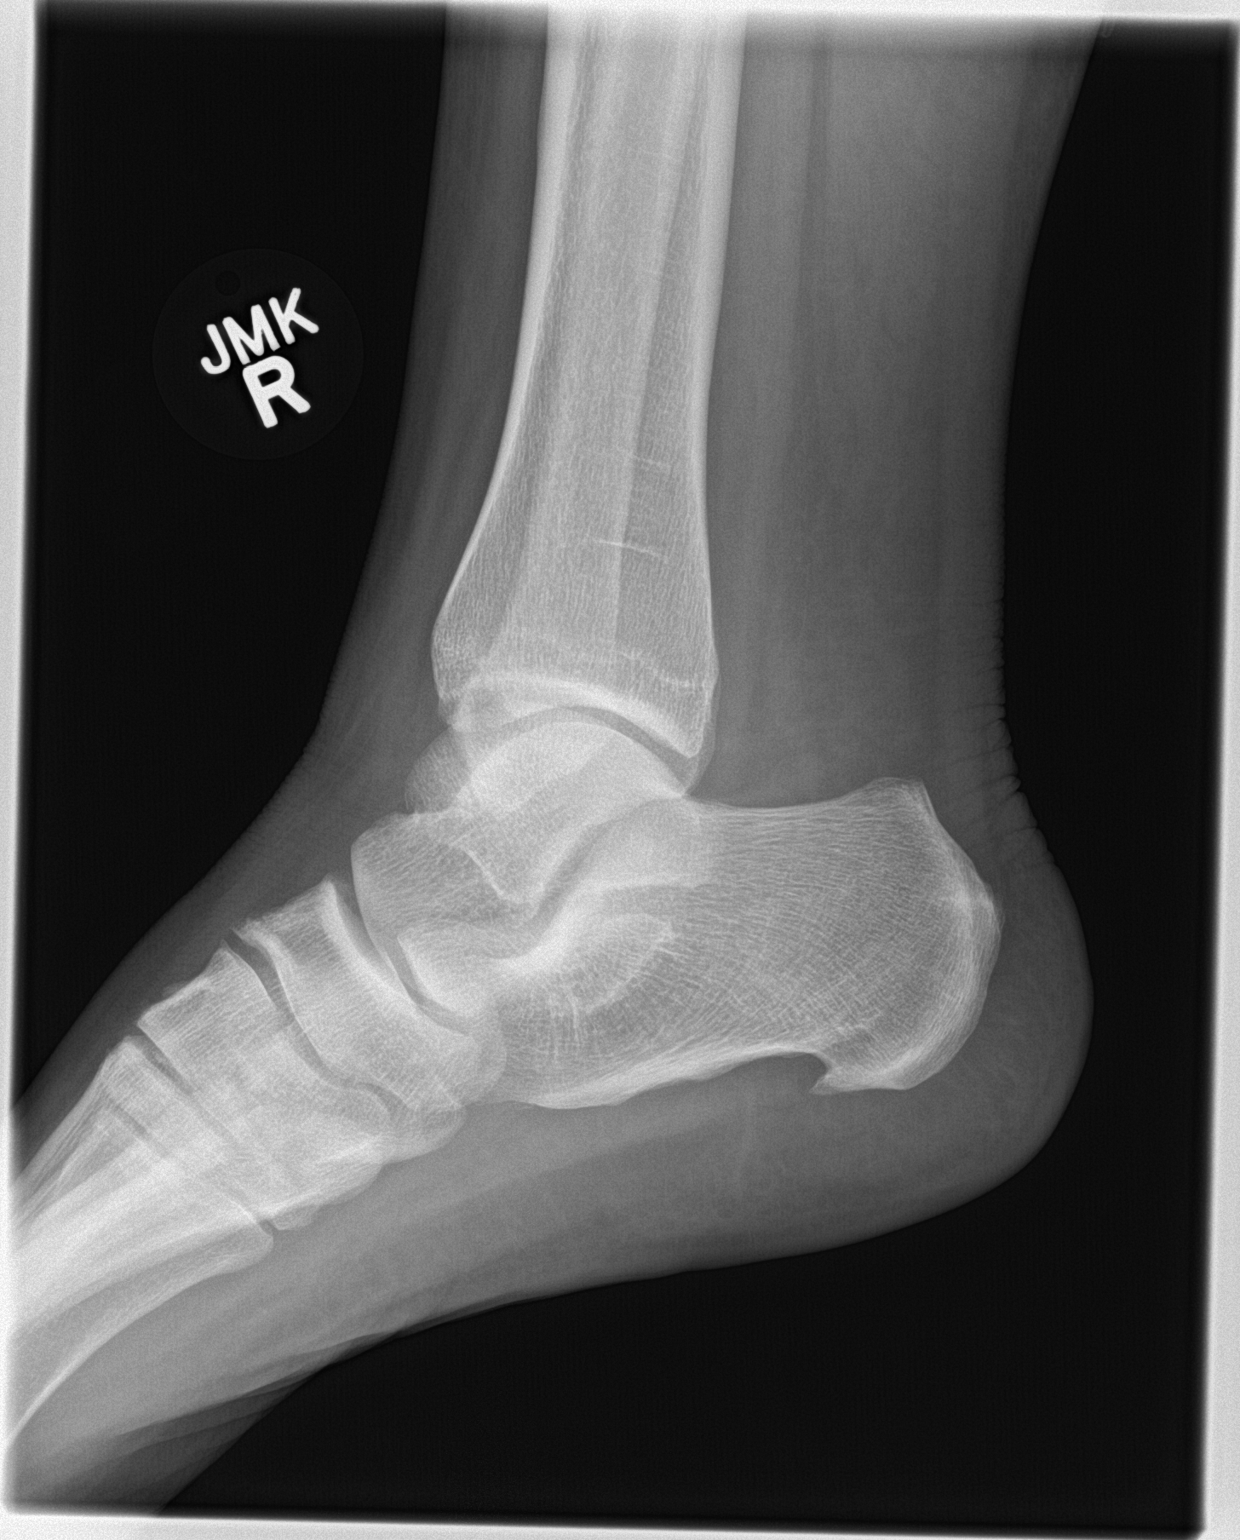

[3 of 3 positions shown; findings below may reference images not displayed]

FINDINGS: There is no evidence of fracture, dislocation, or joint effusion.
There is no evidence of arthropathy or other focal bone abnormality.
Soft tissues are unremarkable.
IMPRESSION: Normal right ankle.

## 2016-03-01 ENCOUNTER — Encounter (HOSPITAL_COMMUNITY): Payer: Self-pay | Admitting: Nurse Practitioner

## 2016-03-01 ENCOUNTER — Emergency Department (HOSPITAL_COMMUNITY)
Admission: EM | Admit: 2016-03-01 | Discharge: 2016-03-01 | Disposition: A | Discharge status: Home / Self Care | Payer: Medicaid Other | Attending: Emergency Medicine | Admitting: Emergency Medicine

## 2016-03-01 DIAGNOSIS — B9689 Other specified bacterial agents as the cause of diseases classified elsewhere: Secondary | ICD-10-CM

## 2016-03-01 DIAGNOSIS — F17210 Nicotine dependence, cigarettes, uncomplicated: Secondary | ICD-10-CM | POA: Diagnosis not present

## 2016-03-01 DIAGNOSIS — N760 Acute vaginitis: Secondary | ICD-10-CM | POA: Insufficient documentation

## 2016-03-01 DIAGNOSIS — N898 Other specified noninflammatory disorders of vagina: Secondary | ICD-10-CM

## 2016-03-01 LAB — WET PREP, GENITAL
Sperm: NONE SEEN
Trich, Wet Prep: NONE SEEN
WBC, Wet Prep HPF POC: NONE SEEN
Yeast Wet Prep HPF POC: NONE SEEN

## 2016-03-01 LAB — POC URINE PREG, ED: PREG TEST UR: NEGATIVE

## 2016-03-01 MED ORDER — CEFTRIAXONE SODIUM 250 MG IJ SOLR
250.0000 mg | Freq: Once | INTRAMUSCULAR | Status: AC
  Administered 2016-03-01: 250 mg via INTRAMUSCULAR
  Filled 2016-03-01: qty 250

## 2016-03-01 MED ORDER — LIDOCAINE HCL (PF) 1 % IJ SOLN
2.1000 mL | Freq: Once | INTRAMUSCULAR | Status: AC
  Administered 2016-03-01: 2.1 mL

## 2016-03-01 MED ORDER — AZITHROMYCIN 250 MG PO TABS
1000.0000 mg | ORAL_TABLET | Freq: Once | ORAL | Status: AC
  Administered 2016-03-01: 1000 mg via ORAL
  Filled 2016-03-01: qty 4

## 2016-03-01 MED ORDER — METRONIDAZOLE IN NACL 5-0.79 MG/ML-% IV SOLN
500.0000 mg | Freq: Once | INTRAVENOUS | Status: AC
  Administered 2016-03-01: 500 mg via INTRAVENOUS
  Filled 2016-03-01: qty 100

## 2016-03-01 MED ORDER — METRONIDAZOLE 500 MG PO TABS: 500.0000 mg | ORAL_TABLET | Freq: Two times a day (BID) | ORAL | 0 refills | Status: DC

## 2016-03-01 MED ORDER — LIDOCAINE HCL (PF) 1 % IJ SOLN
INTRAMUSCULAR | Status: AC
  Administered 2016-03-01: 2.1 mL
  Filled 2016-03-01: qty 5

## 2016-03-01 NOTE — ED Provider Notes (Signed)
[facility] Provider Note   CSN: [id number] Arrival date & time: 03/01/16  1527     History   Chief Complaint Chief Complaint  Patient presents with  . SEXUALLY TRANSMITTED DISEASE    HPI Kim Hudson is a 25 y.o. female.  Patient presents because of vaginal odor. Last time she had intercourse was about 1 month ago. No condoms. No concern about pregnancy; just finishing her period. Denies any discharge or pain. No itching. No fevers/chills/diaphoresis or abd pain. No dysuria, hematuria or frequency. Feels safe at home. Nothing tried to make the odor better. Has stayed constant. Happens regularly. History of STDs, treated each time. Nothing makes it worse or better.    The history is provided by the patient. No language interpreter was used.    Past Medical History:  Diagnosis Date  . Back pain   . GERD (gastroesophageal reflux disease)   . Obese     There are no active problems to display for this patient.   Past Surgical History:  Procedure Laterality Date  . NO PAST SURGERIES      OB History    Gravida Para Term Preterm AB Living   0 0 0 0 0 0   SAB TAB Ectopic Multiple Live Births   0 0 0 0         Home Medications    Prior to Admission medications   Medication Sig Start Date End Date Taking? Authorizing Provider  acetaminophen (TYLENOL) 500 MG tablet Take 1 tablet (500 mg total) by mouth every 6 (six) hours as needed. 10/16/15   Fayrene HelperBowie Tran, PA-C  naproxen sodium (ANAPROX) 220 MG tablet Take 440 mg by mouth 2 (two) times daily as needed (pain).    Historical Provider, MD    Family History Family History  Problem Relation Age of Onset  . Hypertension Mother     Social History Social History  Substance Use Topics  . Smoking status: Current Some Day Smoker    Types: Cigars  . Smokeless tobacco: Never Used  . Alcohol use Yes     Allergies   Review of patient's allergies indicates no known allergies.   Review of Systems Review of  Systems  Constitutional: Negative for chills, diaphoresis and fever.  HENT: Negative.   Eyes: Negative.   Respiratory: Negative.   Cardiovascular: Negative.   Gastrointestinal: Negative for abdominal pain, diarrhea, nausea and vomiting.  Genitourinary: Negative for dysuria, enuresis, flank pain, frequency, hematuria, menstrual problem, pelvic pain, urgency, vaginal bleeding, vaginal discharge and vaginal pain.  Musculoskeletal: Negative for arthralgias, back pain and joint swelling.  Skin: Negative for rash.  Neurological: Negative.   Psychiatric/Behavioral: Negative.   All other systems reviewed and are negative.    Physical Exam Updated Vital Signs BP 126/70   Pulse 79   Temp 98.6 F (37 C) (Oral)   Resp 12   Ht 5\' 7"  (1.702 m)   LMP 02/22/2016   SpO2 98%   Physical Exam  Constitutional: She is oriented to person, place, and time. She appears well-developed and well-nourished. No distress.  HENT:  Head: Normocephalic and atraumatic.  Eyes: Conjunctivae are normal. No scleral icterus.  Neck: Normal range of motion. Neck supple. No tracheal deviation present.  Cardiovascular: Normal rate, regular rhythm, normal heart sounds and intact distal pulses.   No murmur heard. Pulmonary/Chest: Effort normal and breath sounds normal. No stridor. No respiratory distress. She has no wheezes. She has no rales.  Abdominal: Soft. She exhibits no distension.  There is no tenderness. There is no rebound and no guarding.  Genitourinary: Vagina normal and uterus normal. There is no rash, tenderness, lesion or injury on the right labia. There is no rash, tenderness, lesion or injury on the left labia. Uterus is not deviated, not enlarged, not fixed and not tender. Cervix exhibits no motion tenderness, no discharge and no friability. Right adnexum displays no mass, no tenderness and no fullness. Left adnexum displays no mass, no tenderness and no fullness. No erythema or tenderness in the vagina. No  vaginal discharge found.  Genitourinary Comments: Odor present on exam  Musculoskeletal: She exhibits no edema, tenderness or deformity.  Neurological: She is alert and oriented to person, place, and time. She is not disoriented. GCS eye subscore is 4. GCS verbal subscore is 5. GCS motor subscore is 6.  Skin: Skin is warm. Capillary refill takes less than 2 seconds. No rash noted. She is not diaphoretic.  Psychiatric: She has a normal mood and affect. Her behavior is normal. Judgment and thought content normal.  Nursing note and vitals reviewed.    ED Treatments / Results  Labs (all labs ordered are listed, but only abnormal results are displayed) Labs Reviewed  WET PREP, GENITAL - Abnormal; Notable for the following:       Result Value   Clue Cells Wet Prep HPF POC PRESENT (*)    All other components within normal limits  POC URINE PREG, ED  GC/CHLAMYDIA PROBE AMP ([place]) NOT AT [facility]    EKG  EKG Interpretation None       Radiology No results found.  Procedures Procedures (including critical care time)  Medications Ordered in ED Medications  cefTRIAXone (ROCEPHIN) injection 250 mg (250 mg Intramuscular Given 03/01/16 1949)  azithromycin (ZITHROMAX) tablet 1,000 mg (1,000 mg Oral Given 03/01/16 1948)  metroNIDAZOLE (FLAGYL) IVPB 500 mg (0 mg Intravenous Stopped 03/01/16 2045)  lidocaine (PF) (XYLOCAINE) 1 % injection 2.1 mL (2.1 mLs Other Given 03/01/16 1949)  Lidocaine not administered to this patient.   Initial Impression / Assessment and Plan / ED Course  I have reviewed the triage vital signs and the nursing notes.  Pertinent labs & imaging results that were available during my care of the patient were reviewed by me and considered in my medical decision making (see chart for details).  Clinical Course   Pt presents with symptoms and CC concerning for Bv. Pelvic exam performed and there is low suspicion for any addition issues that need to be addressed. Sent  wet prep and GC after exam. Wet prep confirmed Bv. Gave patient first dose of flagyl and Rx for it. Empirically treatment for GC with rocephin and azithromycin. Patient is not currently pregnant. Emphasize importance of protected sex. Patient feels safe at home. No further issues.  Discussed results and plan with patient. Stated understanding and agreement. Usual and customary GU return precautions given. Pt and VS stable at dc.  Final Clinical Impressions(s) / ED Diagnoses   Final diagnoses:  Vaginal odor  Bacterial vaginosis     New Prescriptions Discharge Medication List as of 03/01/2016  9:25 PM    START taking these medications   Details  metroNIDAZOLE (FLAGYL) 500 MG tablet Take 1 tablet (500 mg total) by mouth 2 (two) times daily., Starting Mon 03/01/2016, Print         Maretta BeesLouis Kimbree Casanas, MD 03/05/16 [id number]    Derwood KaplanAnkit Nanavati, MD 03/08/16 2216

## 2016-03-01 NOTE — ED Triage Notes (Signed)
Pt is requesting STD check. Reports foul vaginal smell this week. She denies itching, discharge, n/v, pain. She is alert and breathing easily

## 2016-03-02 LAB — GC/CHLAMYDIA PROBE AMP (~~LOC~~) NOT AT ARMC
Chlamydia: NEGATIVE
Neisseria Gonorrhea: NEGATIVE

## 2016-03-15 ENCOUNTER — Encounter (HOSPITAL_COMMUNITY): Payer: Self-pay | Admitting: Emergency Medicine

## 2016-03-15 ENCOUNTER — Emergency Department (HOSPITAL_COMMUNITY)
Admission: EM | Admit: 2016-03-15 | Discharge: 2016-03-15 | Disposition: A | Discharge status: Home / Self Care | Payer: Medicaid Other | Attending: Emergency Medicine | Admitting: Emergency Medicine

## 2016-03-15 DIAGNOSIS — N898 Other specified noninflammatory disorders of vagina: Secondary | ICD-10-CM | POA: Diagnosis present

## 2016-03-15 DIAGNOSIS — F17210 Nicotine dependence, cigarettes, uncomplicated: Secondary | ICD-10-CM | POA: Insufficient documentation

## 2016-03-15 LAB — URINALYSIS, ROUTINE W REFLEX MICROSCOPIC
Bilirubin Urine: NEGATIVE
Glucose, UA: NEGATIVE mg/dL
Hgb urine dipstick: NEGATIVE
Ketones, ur: NEGATIVE mg/dL
Leukocytes, UA: NEGATIVE
Nitrite: NEGATIVE
Protein, ur: NEGATIVE mg/dL
Specific Gravity, Urine: 1.017 (ref 1.005–1.030)
pH: 6 (ref 5.0–8.0)

## 2016-03-15 LAB — WET PREP, GENITAL
Clue Cells Wet Prep HPF POC: NONE SEEN
Sperm: NONE SEEN
Trich, Wet Prep: NONE SEEN
Yeast Wet Prep HPF POC: NONE SEEN

## 2016-03-15 NOTE — Discharge Instructions (Signed)
Return here as needed.  [facility] °

## 2016-03-15 NOTE — ED Provider Notes (Signed)
[facility] Provider Note   CSN: [id number] Arrival date & time: 03/15/16  4098     History   Chief Complaint Chief Complaint  Patient presents with  . Vaginal Itching  . Vaginal Discharge    HPI Kim Hudson is a 25 y.o. female.  HPI Patient presents to the emergency department with vaginal irritation is been ongoing for the last few weeks.  The patient states that she was using a dildo and noticed that there is irritation.  Patient states she noticed the irritation on the right side of her vaginal area.  She states that when she urinated she noticed that there were some irritation.  She states that she has been attempting to soak in warm time along with keeping the area clean as possible.  The patient states that nothing seems to make the condition better or worse.  Patient denies any vaginal bleeding, vaginal discharge, nausea, vomiting, abdominal pain, back pain, dysuria, incontinence, or syncope.  The patient states that she was recently seen and treated for possible STD Past Medical History:  Diagnosis Date  . Back pain   . GERD (gastroesophageal reflux disease)   . Obese     There are no active problems to display for this patient.   Past Surgical History:  Procedure Laterality Date  . NO PAST SURGERIES      OB History    Gravida Para Term Preterm AB Living   0 0 0 0 0 0   SAB TAB Ectopic Multiple Live Births   0 0 0 0         Home Medications    Prior to Admission medications   Medication Sig Start Date End Date Taking? Authorizing Provider  acetaminophen (TYLENOL) 500 MG tablet Take 1 tablet (500 mg total) by mouth every 6 (six) hours as needed. Patient not taking: Reported on 03/01/2016 10/16/15   Fayrene Helper, PA-C  ibuprofen (ADVIL,MOTRIN) 200 MG tablet Take 400 mg by mouth daily as needed for cramping.    Historical Provider, MD  metroNIDAZOLE (FLAGYL) 500 MG tablet Take 1 tablet (500 mg total) by mouth 2 (two) times daily. 03/01/16   Maretta Bees,  MD    Family History Family History  Problem Relation Age of Onset  . Hypertension Mother     Social History Social History  Substance Use Topics  . Smoking status: Current Some Day Smoker    Types: Cigars  . Smokeless tobacco: Never Used  . Alcohol use Yes     Allergies   Review of patient's allergies indicates no known allergies.   Review of Systems Review of Systems All other systems negative except as documented in the HPI. All pertinent positives and negatives as reviewed in the HPI. Physical Exam Updated Vital Signs BP 130/69   Pulse 73   Temp 97.8 F (36.6 C) (Oral)   Resp 16   Ht 5\' 6"  (1.676 m)   Wt (!) 152.4 kg   LMP 02/22/2016   SpO2 99%   BMI 54.23 kg/m   Physical Exam  Constitutional: She is oriented to person, place, and time. She appears well-developed and well-nourished. No distress.  HENT:  Head: Normocephalic and atraumatic.  Eyes: Pupils are equal, round, and reactive to light.  Cardiovascular: Normal rate, regular rhythm and normal heart sounds.   No murmur heard. Pulmonary/Chest: Effort normal.  Abdominal: Soft. Bowel sounds are normal. She exhibits no distension. There is no tenderness.  Genitourinary: Vagina normal. Pelvic exam was performed with patient  supine. There is no rash, tenderness, lesion or injury on the right labia. There is no rash, tenderness, lesion or injury on the left labia. Cervix exhibits no motion tenderness, no discharge and no friability. Right adnexum displays no mass and no tenderness. Left adnexum displays no mass and no tenderness. No vaginal discharge found.  Neurological: She is alert and oriented to person, place, and time.     ED Treatments / Results  Labs (all labs ordered are listed, but only abnormal results are displayed) Labs Reviewed  WET PREP, GENITAL - Abnormal; Notable for the following:       Result Value   WBC, Wet Prep HPF POC MODERATE (*)    All other components within normal limits    URINALYSIS, ROUTINE W REFLEX MICROSCOPIC (NOT AT [facility]) - Abnormal; Notable for the following:    APPearance CLOUDY (*)    All other components within normal limits  RPR  GC/CHLAMYDIA PROBE AMP ([place]) NOT AT [facility]    EKG  EKG Interpretation None       Radiology No results found.  Procedures Procedures (including critical care time)  Medications Ordered in ED Medications - No data to display   Initial Impression / Assessment and Plan / ED Course  I have reviewed the triage vital signs and the nursing notes.  Pertinent labs & imaging results that were available during my care of the patient were reviewed by me and considered in my medical decision making (see chart for details).  Clinical Course    At this point, I did not identify any vaginal irritation or lesions.  Advised the patient that we will wait for the results of her cultures before any further treatment since she was recently treated for STDs.  Patient agrees the plan and all questions were answered  Final Clinical Impressions(s) / ED Diagnoses   Final diagnoses:  Vaginal irritation    New Prescriptions New Prescriptions   No medications on file     Charlestine NightChristopher Torrey Horseman, PA-C 03/15/16 1019    Lorre NickAnthony Allen, MD 03/16/16 1711

## 2016-03-15 NOTE — ED Triage Notes (Addendum)
Pt in from home with c/o "allergic rxn from dildo", s&s started 2 wks ago. Pt states she was treated for STD's just before use. Reports burning with urination, itching and white discharge x 3 wks now.

## 2016-03-16 LAB — GC/CHLAMYDIA PROBE AMP (~~LOC~~) NOT AT ARMC
Chlamydia: NEGATIVE
Neisseria Gonorrhea: NEGATIVE

## 2016-03-16 LAB — RPR: RPR Ser Ql: NONREACTIVE

## 2016-07-12 NOTE — L&D Delivery Note (Signed)
Delivery Note Prior to pushing maternal temp to 101+ treated with amp and gent prior to delivery.  Pt progressed to 9.5 with involuntary pushing and increased pressure, minimal anterior lip reduced.  Pt pushed with varying amount of effort.  At 10:45 PM a viable and healthy female was delivered via  (Presentation: OA; LOT  ).  APGAR: 2, 9; weight  P.   Placenta status: manually extracted intact.  Cord: 3V with the following complications: none .   Anesthesia: epidural  Episiotomy:  None  Lacerations:  Perineal abrasion Suture Repair: 3.0 vicryl rapide Est. Blood Loss (mL): 400cc   Mom to postpartum.  Baby to Couplet care / Skin to Skin.  Kim Hudson 03/04/2017, 11:10 PM  Br/O+/Contra?/[facility]

## 2016-08-16 ENCOUNTER — Emergency Department (HOSPITAL_COMMUNITY)
Admission: EM | Admit: 2016-08-16 | Discharge: 2016-08-16 | Disposition: A | Discharge status: Home / Self Care | Payer: Medicaid Other | Attending: Emergency Medicine | Admitting: Emergency Medicine

## 2016-08-16 ENCOUNTER — Encounter (HOSPITAL_COMMUNITY): Payer: Self-pay | Admitting: *Deleted

## 2016-08-16 DIAGNOSIS — O26891 Other specified pregnancy related conditions, first trimester: Secondary | ICD-10-CM | POA: Diagnosis not present

## 2016-08-16 DIAGNOSIS — Z3A11 11 weeks gestation of pregnancy: Secondary | ICD-10-CM | POA: Diagnosis not present

## 2016-08-16 DIAGNOSIS — R1030 Lower abdominal pain, unspecified: Secondary | ICD-10-CM | POA: Diagnosis not present

## 2016-08-16 DIAGNOSIS — O99331 Smoking (tobacco) complicating pregnancy, first trimester: Secondary | ICD-10-CM | POA: Diagnosis not present

## 2016-08-16 DIAGNOSIS — F17290 Nicotine dependence, other tobacco product, uncomplicated: Secondary | ICD-10-CM | POA: Diagnosis not present

## 2016-08-16 DIAGNOSIS — R109 Unspecified abdominal pain: Secondary | ICD-10-CM

## 2016-08-16 LAB — URINALYSIS, ROUTINE W REFLEX MICROSCOPIC
BILIRUBIN URINE: NEGATIVE
Glucose, UA: NEGATIVE mg/dL
HGB URINE DIPSTICK: NEGATIVE
Ketones, ur: NEGATIVE mg/dL
Leukocytes, UA: NEGATIVE
Nitrite: NEGATIVE
Protein, ur: NEGATIVE mg/dL
Specific Gravity, Urine: 1.015 (ref 1.005–1.030)
pH: 7 (ref 5.0–8.0)

## 2016-08-16 LAB — COMPREHENSIVE METABOLIC PANEL
ALBUMIN: 3.3 g/dL — AB (ref 3.5–5.0)
ALK PHOS: 80 U/L (ref 38–126)
ALT: 32 U/L (ref 14–54)
AST: 24 U/L (ref 15–41)
Anion gap: 9 (ref 5–15)
BUN: 5 mg/dL — AB (ref 6–20)
CALCIUM: 9 mg/dL (ref 8.9–10.3)
CO2: 22 mmol/L (ref 22–32)
CREATININE: 0.58 mg/dL (ref 0.44–1.00)
Chloride: 105 mmol/L (ref 101–111)
GFR calc Af Amer: >60 mL/min (ref >60)
GFR calc non Af Amer: >60 mL/min (ref >60)
GLUCOSE: 94 mg/dL (ref 65–99)
Potassium: 4.1 mmol/L (ref 3.5–5.1)
Sodium: 136 mmol/L (ref 135–145)
Total Bilirubin: 0.5 mg/dL (ref 0.3–1.2)
Total Protein: 6.3 g/dL — ABNORMAL LOW (ref 6.5–8.1)

## 2016-08-16 LAB — CBC
HCT: 36.6 % (ref 36.0–46.0)
Hemoglobin: 12 g/dL (ref 12.0–15.0)
MCH: 26.4 pg (ref 26.0–34.0)
MCHC: 32.8 g/dL (ref 30.0–36.0)
MCV: 80.4 fL (ref 78.0–100.0)
PLATELETS: 237 10*3/uL (ref 150–400)
RBC: 4.55 MIL/uL (ref 3.87–5.11)
RDW: 13.7 % (ref 11.5–15.5)
WBC: 5 10*3/uL (ref 4.0–10.5)

## 2016-08-16 LAB — WET PREP, GENITAL
CLUE CELLS WET PREP: NONE SEEN
Sperm: NONE SEEN
Trich, Wet Prep: NONE SEEN
Yeast Wet Prep HPF POC: NONE SEEN

## 2016-08-16 LAB — I-STAT BETA HCG BLOOD, ED (MC, WL, AP ONLY)

## 2016-08-16 NOTE — ED Notes (Signed)
Pt ambulated to room from waiting area. Pt ambulated with a steady gait and had no complaints. Placed on bp and o2 monitor.

## 2016-08-16 NOTE — ED Triage Notes (Signed)
Pt states this is her first pregnancy and she's experiencing lower abdominal pain every morning.   Denies bleeding.  States she has tried to get appointment with obgyn but they won't call her back.  LMP Nov 17.

## 2016-08-16 NOTE — ED Notes (Signed)
Pelvic cart at bedside. 

## 2016-08-16 NOTE — ED Provider Notes (Signed)
[facility] Provider Note   CSN: [id number] Arrival date & time: 08/16/16  0850     History   Chief Complaint Chief Complaint  Patient presents with  . Abdominal Pain    pregnant    HPI Kim Hudson is a 26 y.o. female.  HPI Patient is pregnant. Reportedly has positive pregnancy test at home. States she has not seen anyone for orbital states her doctor told her she was probably around [redacted] weeks pregnant at the time. Last period was the middle of November. Which would put her at about 10 weeks right now. States she is normally regular. States that in the morning she gets some dull midabdominal pain. Some nausea with it. Usually resolves by later in the morning. She will occasionally at other times but normally only lab in the morning. No vaginal bleeding or discharge. No dysuria. Will get nausea sometimes. This is her first pregnancy she is just a little nervous. She states she does not know what normally is. States she has been trying to get into her OB/GYN but hasn't been able to get in to them yet.   Past Medical History:  Diagnosis Date  . Back pain   . GERD (gastroesophageal reflux disease)   . Obese     There are no active problems to display for this patient.   Past Surgical History:  Procedure Laterality Date  . NO PAST SURGERIES      OB History    Gravida Para Term Preterm AB Living   0 0 0 0 0 0   SAB TAB Ectopic Multiple Live Births   0 0 0 0         Home Medications    Prior to Admission medications   Medication Sig Start Date End Date Taking? Authorizing Provider  Prenatal Vit-Fe Fumarate-FA (PREPLUS) 27-1 MG TABS Take 1 tablet by mouth daily. 08/07/16  Yes Historical Provider, MD  acetaminophen (TYLENOL) 500 MG tablet Take 1 tablet (500 mg total) by mouth every 6 (six) hours as needed. Patient not taking: Reported on 03/01/2016 10/16/15   Fayrene Helper, PA-C    Family History Family History  Problem Relation Age of Onset  . Hypertension Mother       Social History Social History  Substance Use Topics  . Smoking status: Current Some Day Smoker    Types: Cigars  . Smokeless tobacco: Never Used  . Alcohol use No     Allergies   Patient has no known allergies.   Review of Systems Review of Systems  Constitutional: Negative for appetite change.  HENT: Negative for congestion.   Respiratory: Negative for shortness of breath.   Cardiovascular: Negative for chest pain.  Gastrointestinal: Positive for abdominal pain.  Genitourinary: Negative for dysuria, menstrual problem, vaginal bleeding and vaginal discharge.  Musculoskeletal: Negative for back pain.  Skin: Negative for wound.  Neurological: Negative for tremors and headaches.  Psychiatric/Behavioral: Negative for confusion.     Physical Exam Updated Vital Signs BP 101/85 (BP Location: Right Arm)   Pulse 73   Temp 99.1 F (37.3 C) (Oral)   Resp 18   Ht 5\' 7"  (1.702 m)   Wt 300 lb (136.1 kg)   LMP 05/28/2016   SpO2 97%   BMI 46.99 kg/m   Physical Exam  Constitutional: She appears well-developed.  Patient is obese  HENT:  Head: Atraumatic.  Eyes: EOM are normal.  Neck: Neck supple.  Cardiovascular: Normal rate.   Abdominal: Soft. She exhibits no mass.  There is no tenderness.  Genitourinary:  Genitourinary Comments: Cervical os is closed. No cervical discharge. Slight vaginal discharge that is thick and white. No adnexal tenderness.  Musculoskeletal: She exhibits no edema.  Neurological: She is alert.  Skin: Skin is warm. Capillary refill takes less than 2 seconds.  Psychiatric: She has a normal mood and affect.     ED Treatments / Results  Labs (all labs ordered are listed, but only abnormal results are displayed) Labs Reviewed  WET PREP, GENITAL - Abnormal; Notable for the following:       Result Value   WBC, Wet Prep HPF POC MANY (*)    All other components within normal limits  COMPREHENSIVE METABOLIC PANEL - Abnormal; Notable for the  following:    BUN 5 (*)    Total Protein 6.3 (*)    Albumin 3.3 (*)    All other components within normal limits  URINALYSIS, ROUTINE W REFLEX MICROSCOPIC - Abnormal; Notable for the following:    APPearance HAZY (*)    All other components within normal limits  I-STAT BETA HCG BLOOD, ED ([facility], [facility], [facility] ONLY) - Abnormal; Notable for the following:    I-stat hCG, quantitative >2,000.0 (*)    All other components within normal limits  CBC  RPR  HIV ANTIBODY (ROUTINE TESTING)  GC/CHLAMYDIA PROBE AMP ([place]) NOT AT [facility]    EKG  EKG Interpretation None       Radiology No results found.  Procedures Procedures (including critical care time)  Medications Ordered in ED Medications - No data to display   Initial Impression / Assessment and Plan / ED Course  I have reviewed the triage vital signs and the nursing notes.  Pertinent labs & imaging results that were available during my care of the patient were reviewed by me and considered in my medical decision making (see chart for details).     Patient with abdominal pain in the mornings. She is probably around 10-[redacted] weeks pregnant. Bedside ultrasound did show intrauterine pregnancy. Labs reassuring. Wet prep done which showed only some white cells. GC chlamydia syphilis and HIV pending. Will follow with her OB.  Final Clinical Impressions(s) / ED Diagnoses   Final diagnoses:  Abdominal pain during pregnancy in first trimester    New Prescriptions Discharge Medication List as of 08/16/2016  1:08 PM       Benjiman CoreNathan Dorota Heinrichs, MD 08/16/16 1601

## 2016-08-16 NOTE — Discharge Instructions (Signed)
Follow up with OB as planned

## 2016-08-16 NOTE — ED Notes (Signed)
[place] and ginger ale sandwich given.

## 2016-08-17 LAB — HIV ANTIBODY (ROUTINE TESTING W REFLEX): HIV Screen 4th Generation wRfx: NONREACTIVE

## 2016-08-17 LAB — RPR: RPR Ser Ql: NONREACTIVE

## 2016-08-17 LAB — GC/CHLAMYDIA PROBE AMP (~~LOC~~) NOT AT ARMC
CHLAMYDIA, DNA PROBE: NEGATIVE
Neisseria Gonorrhea: NEGATIVE

## 2016-08-25 LAB — OB RESULTS CONSOLE GC/CHLAMYDIA
Chlamydia: NEGATIVE
GC PROBE AMP, GENITAL: NEGATIVE

## 2016-08-25 LAB — OB RESULTS CONSOLE ABO/RH: RH Type: POSITIVE

## 2016-08-25 LAB — OB RESULTS CONSOLE ANTIBODY SCREEN: ANTIBODY SCREEN: NEGATIVE

## 2016-08-25 LAB — OB RESULTS CONSOLE HIV ANTIBODY (ROUTINE TESTING): HIV: NONREACTIVE

## 2016-08-25 LAB — OB RESULTS CONSOLE RPR: RPR: NONREACTIVE

## 2016-08-25 LAB — OB RESULTS CONSOLE RUBELLA ANTIBODY, IGM: Rubella: IMMUNE

## 2016-08-25 LAB — OB RESULTS CONSOLE HEPATITIS B SURFACE ANTIGEN: Hepatitis B Surface Ag: NEGATIVE

## 2016-09-10 ENCOUNTER — Emergency Department (HOSPITAL_COMMUNITY)
Admission: EM | Admit: 2016-09-10 | Discharge: 2016-09-10 | Disposition: A | Discharge status: Home / Self Care | Payer: Medicaid Other | Attending: Emergency Medicine | Admitting: Emergency Medicine

## 2016-09-10 ENCOUNTER — Encounter (HOSPITAL_COMMUNITY): Payer: Self-pay | Admitting: Emergency Medicine

## 2016-09-10 DIAGNOSIS — Z5321 Procedure and treatment not carried out due to patient leaving prior to being seen by health care provider: Secondary | ICD-10-CM | POA: Diagnosis not present

## 2016-09-10 DIAGNOSIS — J029 Acute pharyngitis, unspecified: Secondary | ICD-10-CM | POA: Insufficient documentation

## 2016-09-10 DIAGNOSIS — Z3A16 16 weeks gestation of pregnancy: Secondary | ICD-10-CM | POA: Diagnosis not present

## 2016-09-10 DIAGNOSIS — O99512 Diseases of the respiratory system complicating pregnancy, second trimester: Secondary | ICD-10-CM | POA: Diagnosis present

## 2016-09-10 LAB — RAPID STREP SCREEN (MED CTR MEBANE ONLY): STREPTOCOCCUS, GROUP A SCREEN (DIRECT): NEGATIVE

## 2016-09-10 NOTE — ED Triage Notes (Signed)
Patient with sore throat that started last night.  Patient is 4 months pregnant.

## 2016-09-10 NOTE — ED Notes (Signed)
Pt called for in waiting area for vital sign reassessment. No answer. 

## 2016-09-10 NOTE — ED Notes (Signed)
Pt called for a room no answer 

## 2016-09-12 LAB — CULTURE, GROUP A STREP (THRC)

## 2016-10-07 ENCOUNTER — Other Ambulatory Visit (HOSPITAL_COMMUNITY): Payer: Self-pay | Admitting: Obstetrics and Gynecology

## 2016-10-07 DIAGNOSIS — Z3A22 22 weeks gestation of pregnancy: Secondary | ICD-10-CM

## 2016-10-07 DIAGNOSIS — E669 Obesity, unspecified: Secondary | ICD-10-CM

## 2016-10-07 DIAGNOSIS — Z3689 Encounter for other specified antenatal screening: Secondary | ICD-10-CM

## 2016-10-19 ENCOUNTER — Ambulatory Visit (HOSPITAL_COMMUNITY): Payer: No Typology Code available for payment source

## 2016-11-02 ENCOUNTER — Ambulatory Visit (HOSPITAL_COMMUNITY)
Admission: RE | Admit: 2016-11-02 | Discharge: 2016-11-02 | Disposition: A | Discharge status: Home / Self Care | Payer: Medicaid Other | Source: Ambulatory Visit | Attending: Obstetrics and Gynecology | Admitting: Obstetrics and Gynecology

## 2016-11-02 DIAGNOSIS — Z3A22 22 weeks gestation of pregnancy: Secondary | ICD-10-CM | POA: Diagnosis not present

## 2016-11-02 DIAGNOSIS — Z3689 Encounter for other specified antenatal screening: Secondary | ICD-10-CM | POA: Diagnosis not present

## 2016-11-02 DIAGNOSIS — O99212 Obesity complicating pregnancy, second trimester: Secondary | ICD-10-CM | POA: Insufficient documentation

## 2016-11-02 DIAGNOSIS — E669 Obesity, unspecified: Secondary | ICD-10-CM

## 2017-02-03 LAB — OB RESULTS CONSOLE GBS: STREP GROUP B AG: NEGATIVE

## 2017-03-01 ENCOUNTER — Encounter (HOSPITAL_COMMUNITY): Payer: Self-pay | Admitting: *Deleted

## 2017-03-01 ENCOUNTER — Telehealth (HOSPITAL_COMMUNITY): Payer: Self-pay | Admitting: *Deleted

## 2017-03-01 NOTE — Telephone Encounter (Signed)
Preadmission screen  

## 2017-03-03 ENCOUNTER — Encounter (HOSPITAL_COMMUNITY): Payer: Self-pay

## 2017-03-03 DIAGNOSIS — O99213 Obesity complicating pregnancy, third trimester: Secondary | ICD-10-CM | POA: Diagnosis present

## 2017-03-03 HISTORY — DX: Obesity complicating pregnancy, third trimester: O99.213

## 2017-03-03 NOTE — H&P (Deleted)
  The note originally documented on this encounter has been moved the the encounter in which it belongs.  

## 2017-03-03 NOTE — H&P (Signed)
Kim Hudson is a 26 y.o. female G1P0 at 40 wk for IOL.  Pt is morbidly obese,? CHTN. Relatively uncomplicated PNC except S>D, followed by serial growth ultrasounds. Dated by LMP consistent with first trimester [place].  Tdap given 12/09/16, essential panel negative.  OB History    Gravida Para Term Preterm AB Living   1 0 0 0 0 0   SAB TAB Ectopic Multiple Live Births   0 0 0 0      no abn pap, last 2/18 WNL H/o trich G1 present  Past Medical History:  Diagnosis Date  . Back pain   . GERD (gastroesophageal reflux disease)   . Maternal obesity syndrome, antepartum, third trimester 03/03/2017  . Obese    Past Surgical History:  Procedure Laterality Date  . NO PAST SURGERIES     Family History: family history includes Hypertension in her mother. Social History:  reports that she has been smoking Cigars.  She has never used smokeless tobacco. She reports that she does not drink alcohol or use drugs.single Meds PNV All NKDA     Maternal Diabetes: No Genetic Screening: Normal Maternal Ultrasounds/Referrals: Normal Fetal Ultrasounds or other Referrals:  None Maternal Substance Abuse:  No Significant Maternal Medications:  None Significant Maternal Lab Results:  Lab values include: Group B Strep negative Other Comments:  None  Review of Systems  Constitutional: Negative.   HENT: Negative.   Eyes: Negative.   Respiratory: Negative.   Cardiovascular: Negative.   Gastrointestinal: Negative.   Genitourinary: Negative.   Musculoskeletal: Positive for back pain.  Skin: Negative.   Neurological: Negative.   Psychiatric/Behavioral: Negative.    Maternal Medical History:  Contractions: Frequency: irregular.    Fetal activity: Perceived fetal activity is normal.    Prenatal complications: no prenatal complications Prenatal Complications - Diabetes: none.      Last menstrual period 05/28/2016. Maternal Exam:  Uterine Assessment: Contraction frequency is irregular.    Abdomen: Patient reports no abdominal tenderness. Fundal height is S>D, growth followed WNL7/23/18- EFW5#10.   Estimated fetal weight is see above.   Fetal presentation: vertex  Introitus: Normal vulva. Normal vagina.    Physical Exam  Constitutional: She is oriented to person, place, and time. She appears well-developed and well-nourished.  HENT:  Head: Normocephalic and atraumatic.  Cardiovascular: Normal rate and regular rhythm.   Respiratory: Effort normal and breath sounds normal. No respiratory distress. She has no wheezes.  GI: Soft. Bowel sounds are normal. She exhibits no distension. There is no tenderness.  Musculoskeletal: Normal range of motion.  Neurological: She is alert and oriented to person, place, and time.  Skin: Skin is warm and dry.  Psychiatric: She has a normal mood and affect. Her behavior is normal.    Prenatal labs: ABO, Rh: O/Positive/-- (02/14 0000) Antibody: Negative (02/14 0000) Rubella: Immune (02/14 0000) RPR: Nonreactive (02/14 0000)  HBsAg: Negative (02/14 0000)  HIV: Non-reactive (02/14 0000)  GBS: Negative (07/26 0000)   Essential panel - CF neg, SMA neg, Fragile X neg; Hgb 12.2/Plt 280K/Ur Cx neg/Varicella immune/Hgb electro WNL/First tri screen WNL/AFP WNL/glucola 99 Tdap 12/09/16 [place] - nl NT Female,nl anat, post previa Growth followed - last vtx, nl AFI, EFW 5#10  Assessment/Plan: 26yo G1P0 at 40+ for IOL gbbs neg IOL with cytotec, pitocin, AROM Expect SVD   Malak Orantes Bovard-Stuckert 03/03/2017, 9:50 PM

## 2017-03-04 ENCOUNTER — Inpatient Hospital Stay (HOSPITAL_COMMUNITY)
Admission: AD | Admit: 2017-03-04 | Discharge: 2017-03-06 | DRG: 774 | Disposition: A | Discharge status: Home / Self Care | Payer: Medicaid Other | Source: Ambulatory Visit | Attending: Obstetrics and Gynecology | Admitting: Obstetrics and Gynecology

## 2017-03-04 ENCOUNTER — Inpatient Hospital Stay (HOSPITAL_COMMUNITY): Payer: Medicaid Other | Admitting: Anesthesiology

## 2017-03-04 ENCOUNTER — Encounter (HOSPITAL_COMMUNITY): Payer: Self-pay

## 2017-03-04 ENCOUNTER — Inpatient Hospital Stay (HOSPITAL_COMMUNITY)
Admission: RE | Admit: 2017-03-04 | Discharge: 2017-03-04 | Disposition: A | Discharge status: Home / Self Care | Payer: Medicaid Other | Source: Ambulatory Visit | Attending: Obstetrics and Gynecology | Admitting: Obstetrics and Gynecology

## 2017-03-04 DIAGNOSIS — O26893 Other specified pregnancy related conditions, third trimester: Secondary | ICD-10-CM | POA: Diagnosis present

## 2017-03-04 DIAGNOSIS — Z6841 Body Mass Index (BMI) 40.0 and over, adult: Secondary | ICD-10-CM

## 2017-03-04 DIAGNOSIS — O99213 Obesity complicating pregnancy, third trimester: Secondary | ICD-10-CM

## 2017-03-04 DIAGNOSIS — O99334 Smoking (tobacco) complicating childbirth: Secondary | ICD-10-CM | POA: Diagnosis present

## 2017-03-04 DIAGNOSIS — Z3A40 40 weeks gestation of pregnancy: Secondary | ICD-10-CM | POA: Diagnosis not present

## 2017-03-04 DIAGNOSIS — O99214 Obesity complicating childbirth: Secondary | ICD-10-CM | POA: Diagnosis present

## 2017-03-04 DIAGNOSIS — Z3483 Encounter for supervision of other normal pregnancy, third trimester: Secondary | ICD-10-CM

## 2017-03-04 DIAGNOSIS — F17290 Nicotine dependence, other tobacco product, uncomplicated: Secondary | ICD-10-CM | POA: Diagnosis present

## 2017-03-04 HISTORY — DX: Obesity complicating pregnancy, third trimester: O99.213

## 2017-03-04 LAB — TYPE AND SCREEN
ABO/RH(D): O POS
ANTIBODY SCREEN: NEGATIVE

## 2017-03-04 LAB — CBC
HCT: 37.5 % (ref 36.0–46.0)
Hemoglobin: 12.4 g/dL (ref 12.0–15.0)
MCH: 26.9 pg (ref 26.0–34.0)
MCHC: 33.1 g/dL (ref 30.0–36.0)
MCV: 81.3 fL (ref 78.0–100.0)
PLATELETS: 252 10*3/uL (ref 150–400)
RBC: 4.61 MIL/uL (ref 3.87–5.11)
RDW: 14.7 % (ref 11.5–15.5)
WBC: 10.7 10*3/uL — ABNORMAL HIGH (ref 4.0–10.5)

## 2017-03-04 LAB — ABO/RH: ABO/RH(D): O POS

## 2017-03-04 LAB — RPR: RPR: NONREACTIVE

## 2017-03-04 MED ORDER — EPHEDRINE 5 MG/ML INJ
10.0000 mg | INTRAVENOUS | Status: DC | PRN
  Filled 2017-03-04: qty 2

## 2017-03-04 MED ORDER — ACETAMINOPHEN 650 MG RE SUPP
975.0000 mg | Freq: Once | RECTAL | Status: AC
  Administered 2017-03-04: 975 mg via RECTAL
  Filled 2017-03-04: qty 2

## 2017-03-04 MED ORDER — PHENYLEPHRINE 40 MCG/ML (10ML) SYRINGE FOR IV PUSH (FOR BLOOD PRESSURE SUPPORT)
80.0000 ug | PREFILLED_SYRINGE | INTRAVENOUS | Status: DC | PRN
  Filled 2017-03-04: qty 5

## 2017-03-04 MED ORDER — BUTORPHANOL TARTRATE 1 MG/ML IJ SOLN: 1.0000 mg | INTRAMUSCULAR | Status: DC | PRN

## 2017-03-04 MED ORDER — LIDOCAINE HCL (PF) 1 % IJ SOLN
INTRAMUSCULAR | Status: DC | PRN
  Administered 2017-03-04 (×2): 5 mL via EPIDURAL

## 2017-03-04 MED ORDER — SODIUM CHLORIDE 0.9 % IV SOLN
2.0000 g | Freq: Four times a day (QID) | INTRAVENOUS | Status: DC
  Administered 2017-03-04: 2 g via INTRAVENOUS
  Filled 2017-03-04: qty 2000

## 2017-03-04 MED ORDER — LACTATED RINGERS IV SOLN: 500.0000 mL | Freq: Once | INTRAVENOUS | Status: DC

## 2017-03-04 MED ORDER — LACTATED RINGERS IV SOLN
500.0000 mL | INTRAVENOUS | Status: DC | PRN
  Administered 2017-03-04: 500 mL via INTRAVENOUS

## 2017-03-04 MED ORDER — FENTANYL 2.5 MCG/ML BUPIVACAINE 1/10 % EPIDURAL INFUSION (WH - ANES)
14.0000 mL/h | INTRAMUSCULAR | Status: DC | PRN
  Administered 2017-03-04 (×3): 14 mL/h via EPIDURAL
  Filled 2017-03-04 (×3): qty 100

## 2017-03-04 MED ORDER — DIPHENHYDRAMINE HCL 50 MG/ML IJ SOLN: 12.5000 mg | INTRAMUSCULAR | Status: DC | PRN

## 2017-03-04 MED ORDER — LACTATED RINGERS IV SOLN
INTRAVENOUS | Status: DC
  Administered 2017-03-04 (×2): via INTRAVENOUS

## 2017-03-04 MED ORDER — TERBUTALINE SULFATE 1 MG/ML IJ SOLN: 0.2500 mg | Freq: Once | INTRAMUSCULAR | Status: DC | PRN

## 2017-03-04 MED ORDER — OXYTOCIN 40 UNITS IN LACTATED RINGERS INFUSION - SIMPLE MED
2.5000 [IU]/h | INTRAVENOUS | Status: DC
  Filled 2017-03-04: qty 1000

## 2017-03-04 MED ORDER — SODIUM BICARBONATE 8.4 % IV SOLN
INTRAVENOUS | Status: DC | PRN
  Administered 2017-03-04: 5 mL via EPIDURAL

## 2017-03-04 MED ORDER — BUPIVACAINE HCL (PF) 0.25 % IJ SOLN
INTRAMUSCULAR | Status: DC | PRN
  Administered 2017-03-04 (×2): 5 mL via EPIDURAL
  Administered 2017-03-04: 3 mL via EPIDURAL
  Administered 2017-03-04: 5 mL via EPIDURAL

## 2017-03-04 MED ORDER — MISOPROSTOL 200 MCG PO TABS: 800.0000 ug | ORAL_TABLET | Freq: Once | ORAL | Status: AC

## 2017-03-04 MED ORDER — ONDANSETRON HCL 4 MG/2ML IJ SOLN
4.0000 mg | Freq: Four times a day (QID) | INTRAMUSCULAR | Status: DC | PRN
Start: 2017-03-04 — End: 2017-03-06

## 2017-03-04 MED ORDER — ACETAMINOPHEN 325 MG PO TABS: 650.0000 mg | ORAL_TABLET | ORAL | Status: DC | PRN

## 2017-03-04 MED ORDER — OXYCODONE-ACETAMINOPHEN 5-325 MG PO TABS
2.0000 | ORAL_TABLET | ORAL | Status: DC | PRN
Start: 2017-03-04 — End: 2017-03-06

## 2017-03-04 MED ORDER — PHENYLEPHRINE 40 MCG/ML (10ML) SYRINGE FOR IV PUSH (FOR BLOOD PRESSURE SUPPORT)
80.0000 ug | PREFILLED_SYRINGE | INTRAVENOUS | Status: DC | PRN
  Administered 2017-03-04 (×2): 80 ug via INTRAVENOUS
  Filled 2017-03-04: qty 5

## 2017-03-04 MED ORDER — LIDOCAINE HCL (PF) 1 % IJ SOLN
30.0000 mL | INTRAMUSCULAR | Status: DC | PRN
  Filled 2017-03-04: qty 30

## 2017-03-04 MED ORDER — PHENYLEPHRINE 40 MCG/ML (10ML) SYRINGE FOR IV PUSH (FOR BLOOD PRESSURE SUPPORT)
80.0000 ug | PREFILLED_SYRINGE | INTRAVENOUS | Status: DC | PRN
Start: 2017-03-04 — End: 2017-03-06
  Filled 2017-03-04: qty 5

## 2017-03-04 MED ORDER — DEXTROSE 5 % IV SOLN
2.0000 g | Freq: Two times a day (BID) | INTRAVENOUS | Status: DC
  Administered 2017-03-04 – 2017-03-05 (×2): 2 g via INTRAVENOUS
  Filled 2017-03-04 (×3): qty 2

## 2017-03-04 MED ORDER — ACETAMINOPHEN 40 MG HALF SUPP: 1000.0000 mg | Freq: Once | RECTAL | Status: DC

## 2017-03-04 MED ORDER — OXYTOCIN BOLUS FROM INFUSION
500.0000 mL | Freq: Once | INTRAVENOUS | Status: AC
  Administered 2017-03-04: 500 mL via INTRAVENOUS

## 2017-03-04 MED ORDER — MISOPROSTOL 200 MCG PO TABS
ORAL_TABLET | ORAL | Status: AC
  Administered 2017-03-04: 800 ug
  Filled 2017-03-04: qty 4

## 2017-03-04 MED ORDER — OXYTOCIN 40 UNITS IN LACTATED RINGERS INFUSION - SIMPLE MED
1.0000 m[IU]/min | INTRAVENOUS | Status: DC
  Administered 2017-03-04: 2 m[IU]/min via INTRAVENOUS

## 2017-03-04 MED ORDER — SOD CITRATE-CITRIC ACID 500-334 MG/5ML PO SOLN
30.0000 mL | ORAL | Status: DC | PRN
Start: 2017-03-04 — End: 2017-03-06

## 2017-03-04 MED ORDER — EPHEDRINE 5 MG/ML INJ
10.0000 mg | INTRAVENOUS | Status: DC | PRN
Start: 2017-03-04 — End: 2017-03-06
  Filled 2017-03-04: qty 2

## 2017-03-04 MED ORDER — PHENYLEPHRINE 40 MCG/ML (10ML) SYRINGE FOR IV PUSH (FOR BLOOD PRESSURE SUPPORT)
80.0000 ug | PREFILLED_SYRINGE | INTRAVENOUS | Status: DC | PRN
  Filled 2017-03-04: qty 10
  Filled 2017-03-04: qty 5
  Filled 2017-03-04 (×2): qty 10

## 2017-03-04 MED ORDER — OXYCODONE-ACETAMINOPHEN 5-325 MG PO TABS: 1.0000 | ORAL_TABLET | ORAL | Status: DC | PRN

## 2017-03-04 MED ORDER — DEXTROSE 5 % IV SOLN
250.0000 mg | Freq: Three times a day (TID) | INTRAVENOUS | Status: DC
  Administered 2017-03-04: 250 mg via INTRAVENOUS
  Filled 2017-03-04: qty 6.25

## 2017-03-04 MED ORDER — IBUPROFEN 600 MG PO TABS
600.0000 mg | ORAL_TABLET | Freq: Four times a day (QID) | ORAL | Status: DC
  Administered 2017-03-05 – 2017-03-06 (×7): 600 mg via ORAL
  Filled 2017-03-04 (×7): qty 1

## 2017-03-04 NOTE — Progress Notes (Signed)
Patient ID: Kim Hudson, female   DOB: May 13, 1991, 26 y.o.   MRN: [id number] Received epidural FHT- 150s, mod variability, has had some variable, late, and maybe early decels, irreg ctx, Cat II Continue to monitor FHT and progress

## 2017-03-04 NOTE — Progress Notes (Signed)
Patient attempting to get out of bed. Informed about the risk of falling with an epidural. Patient stated that she wanted to use the doctor's stool to get up. Re-Instructed not to get out of the bed with an epidural.

## 2017-03-04 NOTE — Progress Notes (Signed)
CSW met with [name] at the request of bedside nurse.  Bedside nurse had concerns regarding [name]'s support ([name]'s mother) asking for food from L&D staff. CSW assessed situation and [name]'s mother reported not having the means to purchase food while supporting [name] during L&D.  CSW offered [name]'s mother a lunch voucher and explained that the voucher was a one time offer while [name] was inpatient; [name]'s mother was appreciative.   Laurey Arrow, MSW, LCSW Clinical Social Work [phone]

## 2017-03-04 NOTE — Progress Notes (Signed)
Pharmacy Antibiotic Note  Kim Hudson is a 26 y.o. female admitted on 03/04/2017 for IOL at 40+ weeks.  Pharmacy has been consulted for Gentamicin dosing to r/o Triple I due to maternal temp during labor   Plan: Gentamicin 250mg  IV q8h Will continue to follow and assess need for SCreatinine and further labs based on duration.   Height: 5' 7.5" (171.5 cm) Weight: (!) 365 lb (165.6 kg) IBW/kg (Calculated) : 62.75 Adjusted Dosing Weight: 93.6kg  Temp (24hrs), Avg:99.2 F (37.3 C), Min:98.1 F (36.7 C), Max:101.8 F (38.8 C)   Recent Labs Lab 03/04/17 0120  WBC 10.7*    CrCl cannot be calculated (Patient's most recent lab result is older than the maximum 21 days allowed.).    No Known Allergies  Antimicrobials this admission: Ampicillin 2 gram IV q6h  8/24 >>    Thank you for allowing pharmacy to be a part of this patient's care.  Claybon Jabs 03/04/2017 8:55 PM

## 2017-03-04 NOTE — Progress Notes (Signed)
RN attempting to trace FHR and contractions. Pt refuses to lay back and RN unable to trace while pt. Is sitting. Pt sts she will lay back when she has her epidural.

## 2017-03-04 NOTE — Progress Notes (Signed)
Patient ID: Kim Hudson, female   DOB: 1991/03/03, 26 y.o.   MRN: [id number]   Reviewed H&P, no changes, d/w pt process of labor - plan for augmentation  AFVSS  gen NAD FHTs 140's, mod var, some early/variable decels, category 1-2 toco q 2-10min  AROM for mod mec, w/o diff/comp.  D/w pt meconium and poss of amnioinfusion  IUPC placed FSE placed  SVE 4.5/80/-1  Continue IOL

## 2017-03-04 NOTE — Progress Notes (Signed)
RN attempted to call dr. Terrill Mohr to review strip, unable to get a hold of at this time. Will try back in ten minutes.

## 2017-03-04 NOTE — MAU Note (Signed)
Pt arrived via EMS with c/o contractions since 10pm every 5 minutes last 15 seconds. Pt denies leaking of fluid. Pt c/o a small amount of vaginal bleeding. Pt states baby is moving normally. Pt denies problems with this pregnancy.

## 2017-03-04 NOTE — Progress Notes (Signed)
RN at bedside adjusting cardio. Difficult to trace FHR due to maternal habitus.

## 2017-03-04 NOTE — Anesthesia Pain Management Evaluation Note (Signed)
  CRNA Pain Management Visit Note  Patient: Kim Hudson, 26 y.o., female  "Hello I am a member of the anesthesia team at [facility]. We have an anesthesia team available at all times to provide care throughout the hospital, including epidural management and anesthesia for C-section. I don't know your plan for the delivery whether it a natural birth, water birth, IV sedation, nitrous supplementation, doula or epidural, but we want to meet your pain goals."   1.Was your pain managed to your expectations on prior hospitalizations?   Unable to assess - patient sleeping  2.What is your expectation for pain management during this hospitalization?     Epidural  3.How can we help you reach that goal? Epidural in progress.  Record the patient's initial score and the patient's pain goal.   Pain: 6  Pain Goal: 0 [facility] wants you to be able to say your pain was always managed very well.  Rashel Okeefe 03/04/2017

## 2017-03-04 NOTE — Progress Notes (Signed)
Pt sitting up in bed vomiting. Tracing maternal heart rate due to maternal position.

## 2017-03-04 NOTE — Anesthesia Procedure Notes (Signed)
Epidural Patient location during procedure: OB Start time: 03/04/2017 2:32 AM End time: 03/04/2017 3:19 AM  Staffing Anesthesiologist: Jairo Ben Performed: anesthesiologist   Preanesthetic Checklist Completed: patient identified, surgical consent, pre-op evaluation, timeout performed, IV checked, risks and benefits discussed and monitors and equipment checked  Epidural Patient position: sitting Prep: site prepped and draped and DuraPrep Patient monitoring: blood pressure, continuous pulse ox and heart rate Approach: midline Location: L2-L3 Injection technique: LOR air  Needle:  Needle type: Tuohy  Needle gauge: 17 G Needle length: 9 cm Needle insertion depth: 7 cm Catheter type: closed end flexible Catheter size: 19 Gauge Catheter at skin depth: 15 cm Test dose: negative (1% lidocaine)  Assessment Events: blood not aspirated, injection not painful, no injection resistance, negative IV test and no paresthesia  Additional Notes Pt identified in Labor room.  Monitors applied. Working IV access confirmed. Sterile prep, drape lumbar spine.  1% lido local L 2,3. Pt very uncooperative, pt having difficulty sitting, #17ga Touhy LOR air at 7 cm L 2,3, cath in easily to 15 cm skin. Test dose OK, cath dosed and infusion begun.  Patient asymptomatic, VSS, no heme aspirated.  Sandford Craze, MDReason for block:procedure for pain

## 2017-03-04 NOTE — Progress Notes (Addendum)
Attempted to call Dr. Jackelyn Knife a second time.

## 2017-03-04 NOTE — Progress Notes (Signed)
Patient ID: Kim Hudson, female   DOB: 11/13/1990, 26 y.o.   MRN: [id number]   Uncomfortable with pressure, working w anesthesia  AFVSS gen NAD FHTs 140-150, mod var, category 1-2 toco q 2 min, borderline mVu's  Restart pitocin, increase cautiously  SVE 4.5/90/0  Continue current mgmt

## 2017-03-04 NOTE — Progress Notes (Signed)
Pt scheduled for induction later this am, presents with ctx and bloody show. FHT-140s, mod variability, small variable decels, irreg ctx VE-2-3/60/-2, vtx per RN Will admit and monitor progress

## 2017-03-04 NOTE — Anesthesia Preprocedure Evaluation (Addendum)
Anesthesia Evaluation  Patient identified by MRN, date of birth, ID band Patient awake    Reviewed: Allergy & Precautions, NPO status , Patient's Chart, lab work & pertinent test results  History of Anesthesia Complications Negative for: history of anesthetic complications  Airway Mallampati: I  TM Distance: >3 FB Neck ROM: Full    Dental  (+) Dental Advisory Given   Pulmonary Current Smoker,    breath sounds clear to auscultation       Cardiovascular negative cardio ROS   Rhythm:Regular Rate:Normal     Neuro/Psych negative neurological ROS     GI/Hepatic Neg liver ROS, GERD  ,  Endo/Other  Morbid obesity  Renal/GU negative Renal ROS     Musculoskeletal   Abdominal (+) + obese,   Peds  Hematology plt 252k   Anesthesia Other Findings   Reproductive/Obstetrics (+) Pregnancy                            Anesthesia Physical Anesthesia Plan  ASA: III  Anesthesia Plan: Epidural   Post-op Pain Management:    Induction:   PONV Risk Score and Plan: Treatment may vary due to age or medical condition  Airway Management Planned: Natural Airway  Additional Equipment:   Intra-op Plan:   Post-operative Plan:   Informed Consent: I have reviewed the patients History and Physical, chart, labs and discussed the procedure including the risks, benefits and alternatives for the proposed anesthesia with the patient or authorized representative who has indicated his/her understanding and acceptance.   Dental advisory given  Plan Discussed with:   Anesthesia Plan Comments: (Patient identified. Risks/Benefits/Options discussed with patient including but not limited to bleeding, infection, nerve damage, paralysis, failed block, incomplete pain control, headache, blood pressure changes, nausea, vomiting, reactions to medication both or allergic, itching and postpartum back pain. Confirmed with bedside  nurse the patient's most recent platelet count. Confirmed with patient that they are not currently taking any anticoagulation, have any bleeding history or any family history of bleeding disorders. Patient expressed understanding and wished to proceed. All questions were answered. )       Anesthesia Quick Evaluation

## 2017-03-05 LAB — CBC
HEMATOCRIT: 31.6 % — AB (ref 36.0–46.0)
Hemoglobin: 10.6 g/dL — ABNORMAL LOW (ref 12.0–15.0)
MCH: 27.4 pg (ref 26.0–34.0)
MCHC: 33.5 g/dL (ref 30.0–36.0)
MCV: 81.7 fL (ref 78.0–100.0)
Platelets: 231 10*3/uL (ref 150–400)
RBC: 3.87 MIL/uL (ref 3.87–5.11)
RDW: 14.6 % (ref 11.5–15.5)
WBC: 29 10*3/uL — AB (ref 4.0–10.5)

## 2017-03-05 MED ORDER — WITCH HAZEL-GLYCERIN EX PADS: 1.0000 "application " | MEDICATED_PAD | CUTANEOUS | Status: DC | PRN

## 2017-03-05 MED ORDER — OXYCODONE HCL 5 MG PO TABS
10.0000 mg | ORAL_TABLET | ORAL | Status: DC | PRN
Start: 2017-03-05 — End: 2017-03-06

## 2017-03-05 MED ORDER — OXYCODONE HCL 5 MG PO TABS: 5.0000 mg | ORAL_TABLET | ORAL | Status: DC | PRN

## 2017-03-05 MED ORDER — ONDANSETRON HCL 4 MG/2ML IJ SOLN: 4.0000 mg | INTRAMUSCULAR | Status: DC | PRN

## 2017-03-05 MED ORDER — SENNOSIDES-DOCUSATE SODIUM 8.6-50 MG PO TABS
2.0000 | ORAL_TABLET | ORAL | Status: DC
  Administered 2017-03-05 – 2017-03-06 (×2): 2 via ORAL
  Filled 2017-03-05 (×2): qty 2

## 2017-03-05 MED ORDER — ZOLPIDEM TARTRATE 5 MG PO TABS: 5.0000 mg | ORAL_TABLET | Freq: Every evening | ORAL | Status: DC | PRN

## 2017-03-05 MED ORDER — LACTATED RINGERS IV SOLN: INTRAVENOUS | Status: DC

## 2017-03-05 MED ORDER — BENZOCAINE-MENTHOL 20-0.5 % EX AERO: 1.0000 "application " | INHALATION_SPRAY | CUTANEOUS | Status: DC | PRN

## 2017-03-05 MED ORDER — PRENATAL MULTIVITAMIN CH
1.0000 | ORAL_TABLET | Freq: Every day | ORAL | Status: DC
  Administered 2017-03-05 – 2017-03-06 (×2): 1 via ORAL
  Filled 2017-03-05 (×2): qty 1

## 2017-03-05 MED ORDER — DIPHENHYDRAMINE HCL 25 MG PO CAPS: 25.0000 mg | ORAL_CAPSULE | Freq: Four times a day (QID) | ORAL | Status: DC | PRN

## 2017-03-05 MED ORDER — ONDANSETRON HCL 4 MG PO TABS
4.0000 mg | ORAL_TABLET | ORAL | Status: DC | PRN
  Administered 2017-03-06: 4 mg via ORAL
  Filled 2017-03-05: qty 1

## 2017-03-05 MED ORDER — SIMETHICONE 80 MG PO CHEW: 80.0000 mg | CHEWABLE_TABLET | ORAL | Status: DC | PRN

## 2017-03-05 MED ORDER — ACETAMINOPHEN 325 MG PO TABS: 650.0000 mg | ORAL_TABLET | ORAL | Status: DC | PRN

## 2017-03-05 MED ORDER — DIBUCAINE 1 % RE OINT: 1.0000 "application " | TOPICAL_OINTMENT | RECTAL | Status: DC | PRN

## 2017-03-05 MED ORDER — COCONUT OIL OIL: 1.0000 "application " | TOPICAL_OIL | Status: DC | PRN

## 2017-03-05 NOTE — Progress Notes (Addendum)
CSW acknowledges consult for [name] not wanting FOB to visit. CSW met with [name] at bedside to further assess needs and/or concerns. [name] was warm and welcoming of this writers visit. CSW inquired about she and FOB's relationship. [name] noted she and FOB are not currently together nor are they thinking on the same terms regarding caring for the baby and having a mutual respect for one another regarding the baby. [name] notes she is safe and baby is safe but she has "her reasons" for not wanting his presence currently. CSW inquired if [name] plans to have him involved in baby's life. [name] notes she is more than willing to allow him to be apart so long as they can come to an agreement and work some things out.  [name] had no further concerns at this time. CSW thanked [name] for her willingness to talk with this writer. CSW informed [name] that we are here to fully support her decisions so long as it is what's best for she and baby. [name] was thankful.   Kim Hudson, MSW, LCSW-A Clinical Social Worker  [facility]  Office: [phone]  

## 2017-03-05 NOTE — Lactation Note (Signed)
This note was copied from a baby's chart. Lactation Consultation Note: This is a first time mother who wants to breastfeed her infant. Infant is 5 hours old and has not has a sustained latch. Several attempts with assistance from staff nurse. Mother reports that infant has not latched.  Mother has semi-flat nipples and very large breast.  Infant is getting a bath. Mother was sat up with a DEBP and pumped for 10-15 mins. Mother was taught to hand express. Several attempts and was unable to see any drops of colostrum. Advised mother to hand express before and after feeding and pumping. Advised mother to pump every 2-3 hours for 15-20 mins. Mother is active with WIC. She reports that she plans to purchase a electric pump.  Mother encouraged to page for staff nurse or [facility] with next feeding attempt. Lactation brochure given with basic breastfeeding teaching done.  Patient Name: Kim Hudson [id number] Date: 03/05/2017 Reason for consult: Initial assessment;Difficult latch;1st time breastfeeding   Maternal Data    Feeding    LATCH Score                   Interventions Interventions: Breast feeding basics reviewed;Skin to skin;Hand express  Lactation Tools Discussed/Used     Consult Status Consult Status: Follow-up Date: 03/05/17 Follow-up type: In-patient    Stevan Born [facility] 03/05/2017, 3:54 PM

## 2017-03-05 NOTE — Plan of Care (Signed)
Problem: Education: Goal: Knowledge of condition will improve Outcome: Completed/Met Date Met: 03/05/17 Mother-Baby admission information reviewed. Discussed perineal care, bleeding, bowel and bladder care.

## 2017-03-05 NOTE — Progress Notes (Signed)
Post Partum Day 1 Subjective: no complaints, up ad lib, voiding, tolerating PO and nl lochia, pain controlled  Objective: Blood pressure 95/79, pulse 100, temperature 98.3 F (36.8 C), temperature source Oral, resp. rate 20, height 5' 7.5" (1.715 m), weight (!) 165.6 kg (365 lb), last menstrual period 05/28/2016, SpO2 97 %, unknown if currently breastfeeding.  Physical Exam:  General: alert and no distress Lochia: appropriate Uterine Fundus: firm   Recent Labs  03/04/17 0120 03/05/17 0518  HGB 12.4 10.6*  HCT 37.5 31.6*    Assessment/Plan: Plan for discharge tomorrow, Breastfeeding and Lactation consult.  Routine care.   LOS: 1 day   Renda Pohlman Bovard-Stuckert 03/05/2017, 8:18 AM

## 2017-03-05 NOTE — Plan of Care (Signed)
Problem: Bowel/Gastric: Goal: Gastrointestinal status will improve Outcome: Completed/Met Date Met: 03/05/17 Bowel sounds present. Patient stated that she has had a bowel movement.

## 2017-03-05 NOTE — Anesthesia Postprocedure Evaluation (Signed)
Anesthesia Post Note  Patient: Kim Hudson  Procedure(s) Performed: * No procedures listed *     Patient location during evaluation: Mother Baby Anesthesia Type: Epidural Level of consciousness: awake and alert and oriented Pain management: satisfactory to patient Vital Signs Assessment: post-procedure vital signs reviewed and stable Respiratory status: respiratory function stable Cardiovascular status: stable Postop Assessment: no headache, no backache, epidural receding, patient able to bend at knees, no signs of nausea or vomiting and adequate PO intake Anesthetic complications: no    Last Vitals:  Vitals:   03/05/17 0147 03/05/17 0601  BP: (!) 124/96 95/79  Pulse: (!) 122 100  Resp: 20 20  Temp: 37.5 C 36.8 C  SpO2:      Last Pain:  Vitals:   03/05/17 0601  TempSrc: Oral  PainSc: 0-No pain   Pain Goal: Patients Stated Pain Goal: 0 (03/04/17 0030)               Karleen Dolphin

## 2017-03-06 MED ORDER — IBUPROFEN 800 MG PO TABS: 800.0000 mg | ORAL_TABLET | Freq: Three times a day (TID) | ORAL | 1 refills | Status: DC | PRN

## 2017-03-06 MED ORDER — PREPLUS 27-1 MG PO TABS: 1.0000 | ORAL_TABLET | Freq: Every day | ORAL | 3 refills | Status: DC

## 2017-03-06 NOTE — Lactation Note (Signed)
This note was copied from a baby's chart. Lactation Consultation Note  Patient Name: Kim Hudson [id number] Date: 03/06/2017 Reason for consult: Follow-up assessment   Mother states she switched to formula.    Maternal Data Formula Feeding for Exclusion: Yes  Feeding    LATCH Score                   Interventions    Lactation Tools Discussed/Used     Consult Status Consult Status: Complete    Hardie Pulley 03/06/2017, 9:23 AM

## 2017-03-06 NOTE — Discharge Summary (Signed)
OB Discharge Summary     Patient Name: Atiana Levier DOB: May 07, 1991 MRN: [id number]  Date of admission: 03/04/2017 Delivering MD: Sherian Rein   Date of discharge: 03/06/2017  Admitting diagnosis: 40 WEEKS CTX Intrauterine pregnancy: [redacted]w[redacted]d     Secondary diagnosis:  Principal Problem:   SVD (spontaneous vaginal delivery) Active Problems:   Normal pregnancy in multigravida in third trimester  Additional problems: morbid obesity     Discharge diagnosis: Term Pregnancy Delivered                                                                                                Post partum procedures:n/a  Augmentation: AROM and Pitocin  Complications: None  Hospital course:  Induction of Labor With Vaginal Delivery   26 y.o. yo G1P1001 at [redacted]w[redacted]d was admitted to the hospital 03/04/2017 for induction of labor.  Indication for induction: Favorable cervix at term.  Patient had an uncomplicated labor course as follows: Membrane Rupture Time/Date: 8:45 AM ,03/04/2017   Intrapartum Procedures: Episiotomy: None [1]                                         Lacerations:  Perineal [11]  Patient had delivery of a Viable infant.  Information for the patient's newborn:  Nolan, Lasser [[id number]]  Delivery Method: Vag-Spont   03/04/2017  Details of delivery can be found in separate delivery note.  Patient had a routine postpartum course. Patient is discharged home 03/06/17.  Physical exam  Vitals:   03/05/17 1300 03/05/17 1720 03/05/17 1859 03/06/17 0651  BP: (!) 107/56  (!) 122/56 122/64  Pulse: 87  100 76  Resp:   20 18  Temp: (!) 97.5 F (36.4 C) (!) 97.3 F (36.3 C)  97.7 F (36.5 C)  TempSrc: Oral   Tympanic  SpO2:   100%   Weight:      Height:       General: alert and no distress Lochia: appropriate Uterine Fundus: firm  Labs: Lab Results  Component Value Date   WBC 29.0 (H) 03/05/2017   HGB 10.6 (L) 03/05/2017   HCT 31.6 (L) 03/05/2017   MCV 81.7 03/05/2017    PLT 231 03/05/2017   CMP Latest Ref Rng & Units 08/16/2016  Glucose 65 - 99 mg/dL 94  BUN 6 - 20 mg/dL 5(L)  Creatinine 7.82 - 1.00 mg/dL 9.56  Sodium 213 - 086 mmol/L 136  Potassium 3.5 - 5.1 mmol/L 4.1  Chloride 101 - 111 mmol/L 105  CO2 22 - 32 mmol/L 22  Calcium 8.9 - 10.3 mg/dL 9.0  Total Protein 6.5 - 8.1 g/dL 6.3(L)  Total Bilirubin 0.3 - 1.2 mg/dL 0.5  Alkaline Phos 38 - 126 U/L 80  AST 15 - 41 U/L 24  ALT 14 - 54 U/L 32    Discharge instruction: per After Visit Summary and "Baby and Me Booklet".  After visit meds:  Allergies as of 03/06/2017   No Known Allergies     Medication List  TAKE these medications   ibuprofen 800 MG tablet Commonly known as:  ADVIL,MOTRIN Take 1 tablet (800 mg total) by mouth every 8 (eight) hours as needed.   PREPLUS 27-1 MG Tabs Take 1 tablet by mouth daily.            Discharge Care Instructions        Start     Ordered   03/06/17 0000  ibuprofen (ADVIL,MOTRIN) 800 MG tablet  Every 8 hours PRN     03/06/17 0944   03/06/17 0000  Prenatal Vit-Fe Fumarate-FA (PREPLUS) 27-1 MG TABS  Daily     03/06/17 0944   03/06/17 0000  Increase activity slowly     03/06/17 0944   03/06/17 0000  Diet - low sodium heart healthy     03/06/17 0944   03/06/17 0000  Discharge instructions    Comments:  Call [phone] with questions or problems   03/06/17 0944   03/06/17 0000  May walk up steps     03/06/17 0944   03/06/17 0000  May shower / Bathe     03/06/17 0944   03/06/17 0000  Driving Restrictions    Comments:  While taking strong pain medicine   03/06/17 0944   03/06/17 0000  Sexual Activity Restrictions    Comments:  Pelvic rest - no douching, tampons or sex for 6 weeks   03/06/17 0944   03/06/17 0000  Lifting restrictions    Comments:  No greater than 10-15lbs for 2-3 weeks   03/06/17 0944   03/06/17 0000  Call MD for:  persistant nausea and vomiting     03/06/17 0944   03/06/17 0000  Call MD for:  severe uncontrolled  pain     03/06/17 0944      Diet: routine diet  Activity: Advance as tolerated. Pelvic rest for 6 weeks.   Outpatient follow up:6 weeks Follow up Appt:No future appointments. Follow up Visit:No Follow-up on file.  Postpartum contraception: Undecided  Newborn Data: Live born female  Birth Weight: 7 lb 12.5 oz (3530 g) APGAR: 2, 9  Baby Feeding: Breast Disposition:home with mother   03/06/2017 Sherian Rein, MD

## 2017-03-06 NOTE — Progress Notes (Signed)
Post Partum Day 2 Subjective: no complaints, up ad lib, voiding, tolerating PO and nl lochia, pain controlled  Objective: Blood pressure 122/64, pulse 76, temperature 97.7 F (36.5 C), temperature source Tympanic, resp. rate 18, height 5' 7.5" (1.715 m), weight (!) 165.6 kg (365 lb), last menstrual period 05/28/2016, SpO2 100 %, unknown if currently breastfeeding.  Physical Exam:  General: alert and no distress Lochia: appropriate Uterine Fundus: firm   Recent Labs  03/04/17 0120 03/05/17 0518  HGB 12.4 10.6*  HCT 37.5 31.6*    Assessment/Plan: Discharge home, Breastfeeding and Lactation consult.  Routine PP care.  D/c with motrin and PNV.  F/u 6 weeks   LOS: 2 days   Kim Hudson 03/06/2017, 9:15 AM

## 2017-03-13 IMAGING — CR DG FOOT COMPLETE 3+V*L*
3 series · 3 of 3 positions shown · non-contrast
Comparison: None.

CLINICAL DATA: Chronic heel pain with no injury, worse today.
Initial encounter.

EXAM:
LEFT FOOT - COMPLETE 3+ VIEW

[foot ap]
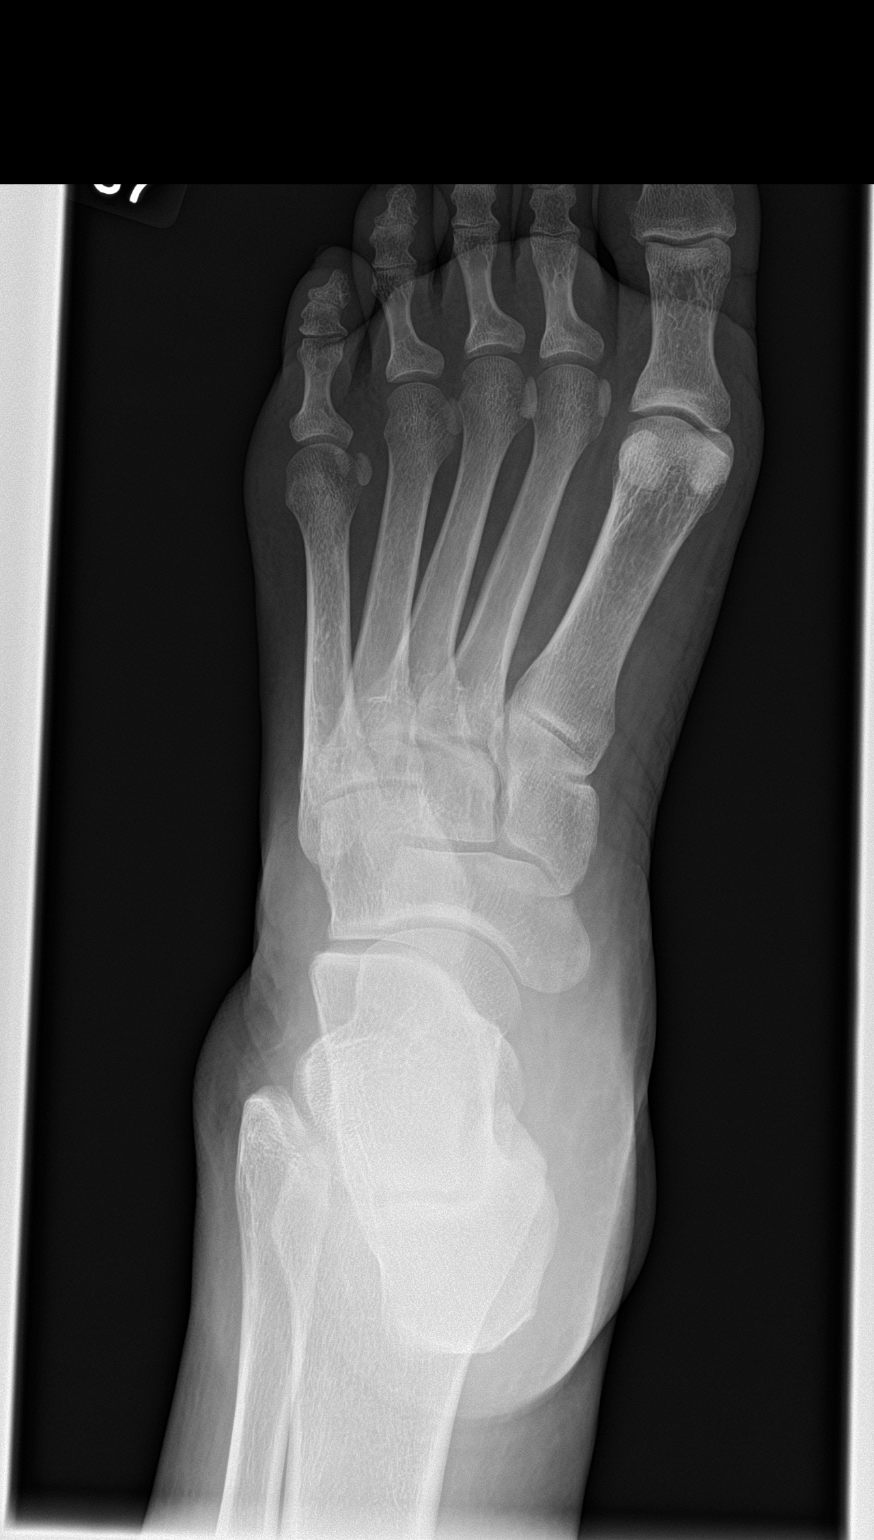

[foot obl]
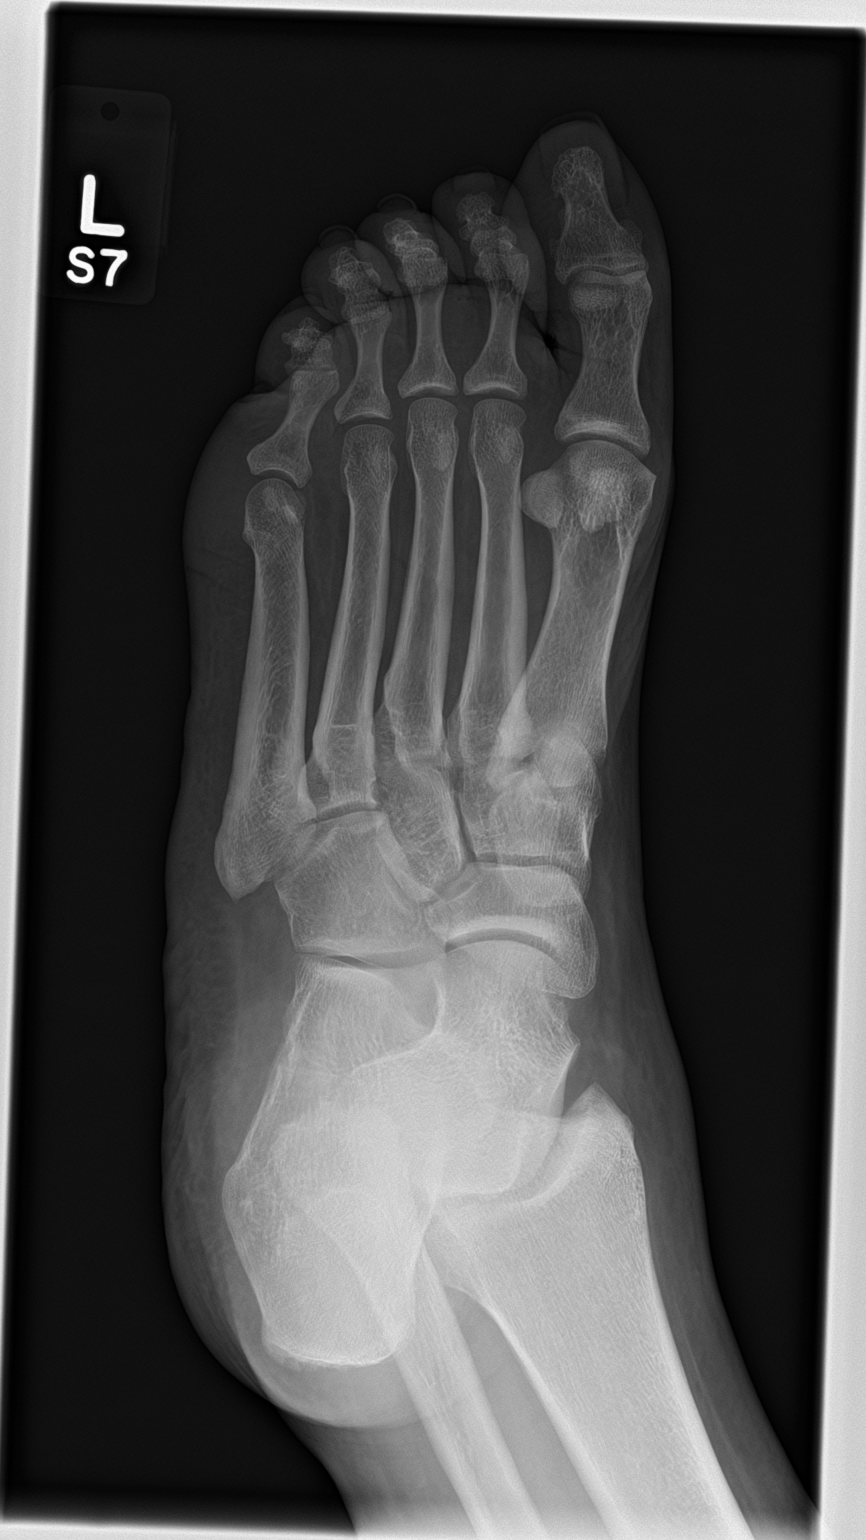

[foot lat]
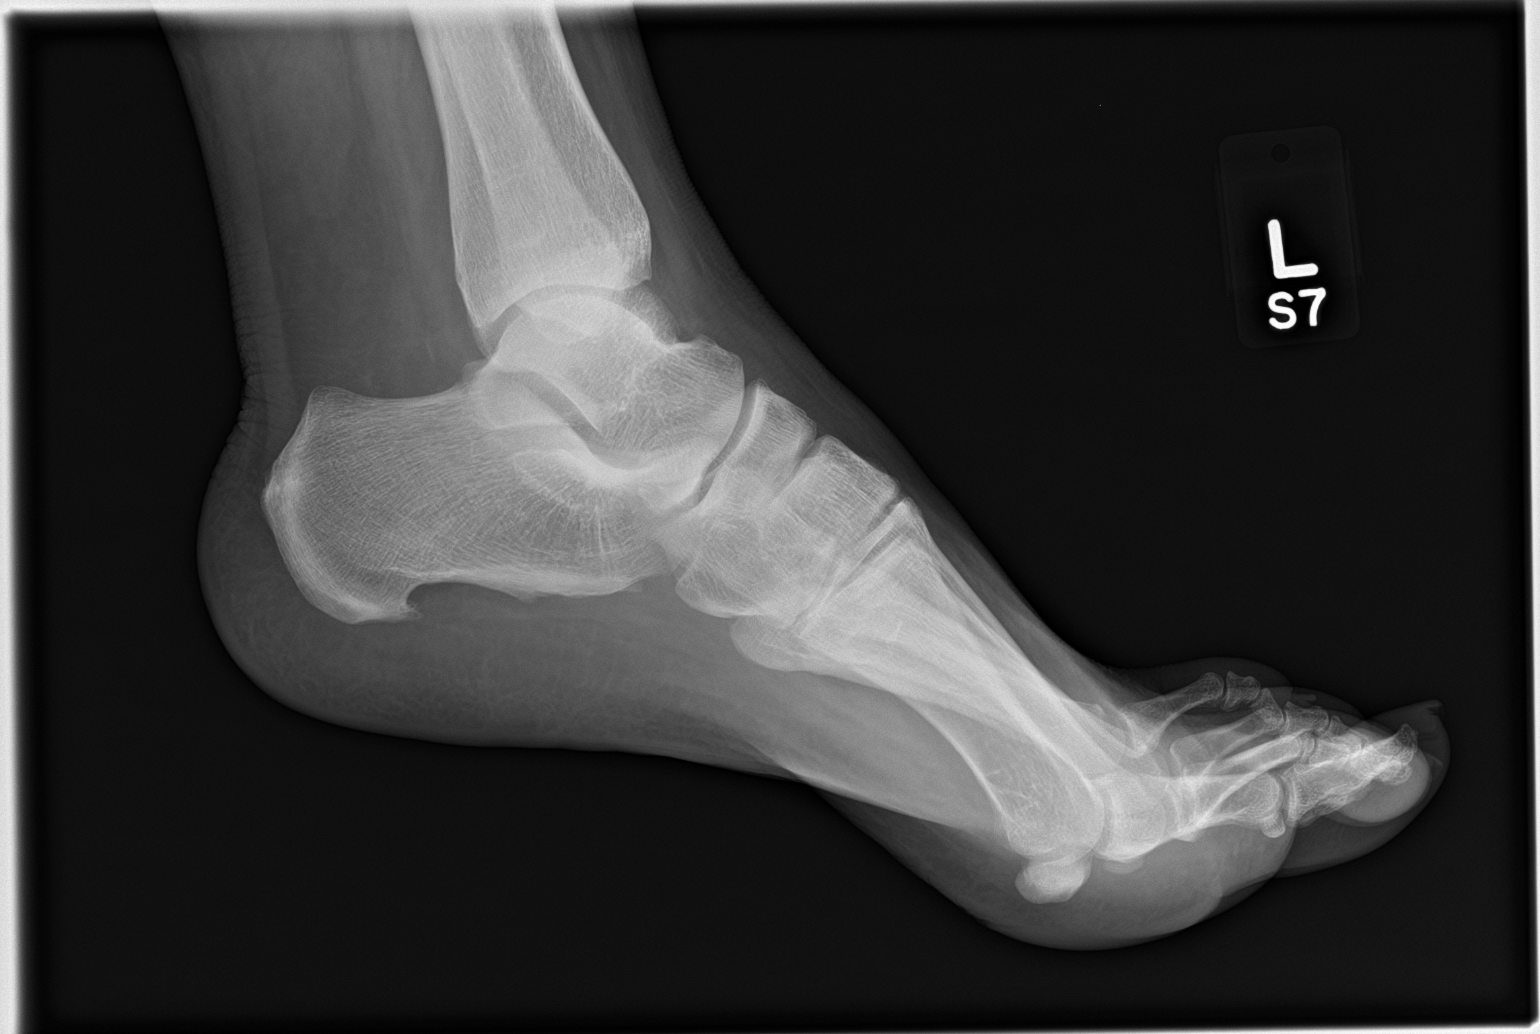

[3 of 3 positions shown; findings below may reference images not displayed]

FINDINGS: No acute or stress fracture. Normal foot alignment with no arthritic
narrowing. Non eroded plantar heel spur which is small.
IMPRESSION: Small non eroded heel spur.

## 2021-06-02 ENCOUNTER — Other Ambulatory Visit: Payer: Self-pay

## 2021-06-02 ENCOUNTER — Ambulatory Visit (HOSPITAL_COMMUNITY)
Admission: EM | Admit: 2021-06-02 | Discharge: 2021-06-02 | Disposition: A | Discharge status: Home / Self Care | Payer: Medicaid Other | Attending: Emergency Medicine | Admitting: Emergency Medicine

## 2021-06-02 ENCOUNTER — Encounter (HOSPITAL_COMMUNITY): Payer: Self-pay

## 2021-06-02 DIAGNOSIS — N898 Other specified noninflammatory disorders of vagina: Secondary | ICD-10-CM

## 2021-06-02 MED ORDER — METRONIDAZOLE 500 MG PO TABS: 500.0000 mg | ORAL_TABLET | Freq: Two times a day (BID) | ORAL | 0 refills | Status: DC

## 2021-06-02 NOTE — Discharge Instructions (Signed)
Today you are being treated prophylactically for  Bacterial vaginosis   Take Metronidazole 500 mg twice a day for 7 days, do not drink alcohol while using medication, this will make you feel sick   Bacterial vaginosis which results from an overgrowth of one on several organisms that are normally present in your vagina. Vaginosis is an inflammation of the vagina that can result in discharge, itching and pain.  Labs pending 2-3 days, you will be contacted if positive for any sti and treatment will be sent to the pharmacy, you will have to return to the clinic if positive for gonorrhea to receive treatment   Please refrain from having sex until labs results, if positive please refrain from having sex until treatment complete and symptoms resolve   If positive for  Chlamydia  gonorrhea or trichomoniasis please notify partner or partners so they may tested as well  Moving forward, it is recommended you use some form of protection against the transmission of sti infections  such as condoms or dental dams with each sexual encounter     In addition: Avoid baths, hot tubs and whirlpool spas.  Don't use scented or harsh soaps Avoid irritants. These include scented tampons and pads. Wipe from front to back after using the toilet. Don't douche. Your vagina doesn't require cleansing other than normal bathing.  Use a condom.  Wear cotton underwear, this fabric absorbs some moisture.      

## 2021-06-02 NOTE — ED Triage Notes (Signed)
Pt presents with vaginal odor for past few months.

## 2021-06-02 NOTE — ED Provider Notes (Signed)
[facility]    CSN: [id number] Arrival date & time: 06/02/21  1141      History   Chief Complaint Chief Complaint  Patient presents with   Vaginal Odor    HPI Kim Hudson is a 30 y.o. female.   Patient presents with vaginal odor for 2 weeks,  smell has intensified .endorses intermittent [name] discharge.  Denies vaginal itching or irritation, new rash or lesions, urinary frequency, urgency, dysuria, hematuria, abdominal pain, flank pain, fever, chills. Not sexually active.  Last sexual encounter 2021.  Has not attempted treatment.   Past Medical History:  Diagnosis Date   Back pain    GERD (gastroesophageal reflux disease)    Maternal obesity syndrome, antepartum, third trimester 03/03/2017   Obese    SVD (spontaneous vaginal delivery) 03/04/2017    Patient Active Problem List   Diagnosis Date Noted   Normal pregnancy in multigravida in third trimester 03/04/2017   SVD (spontaneous vaginal delivery) 03/04/2017   Maternal obesity syndrome, antepartum, third trimester 03/03/2017    Past Surgical History:  Procedure Laterality Date   NO PAST SURGERIES      OB History     Gravida  1   Para  1   Term  1   Preterm  0   AB  0   Living  1      SAB  0   IAB  0   Ectopic  0   Multiple  0   Live Births  1            Home Medications    Prior to Admission medications   Medication Sig Start Date End Date Taking? Authorizing Provider  ibuprofen (ADVIL,MOTRIN) 800 MG tablet Take 1 tablet (800 mg total) by mouth every 8 (eight) hours as needed. 03/06/17   Bovard-Stuckert, Augusto Gamble, MD  Prenatal Vit-Fe Fumarate-FA (PREPLUS) 27-1 MG TABS Take 1 tablet by mouth daily. 03/06/17   Bovard-Stuckert, Augusto Gamble, MD  omeprazole (PRILOSEC) 20 MG capsule Take 1 po BID x 2 weeks then once a day Patient not taking: Reported on 06/25/2014 04/26/14 05/05/15  Devoria Albe, MD    Family History Family History  Problem Relation Age of Onset   Hypertension Mother      Social History Social History   Tobacco Use   Smoking status: Some Days    Types: Cigars   Smokeless tobacco: Never  Substance Use Topics   Alcohol use: No   Drug use: No     Allergies   Patient has no known allergies.   Review of Systems Review of Systems  Constitutional: Negative.   Respiratory: Negative.    Cardiovascular: Negative.   Gastrointestinal: Negative.   Genitourinary:  Positive for vaginal discharge. Negative for decreased urine volume, difficulty urinating, dyspareunia, dysuria, enuresis, flank pain, frequency, genital sores, hematuria, menstrual problem, pelvic pain, urgency, vaginal bleeding and vaginal pain.  Skin: Negative.     Physical Exam Triage Vital Signs ED Triage Vitals  Enc Vitals Group     BP 06/02/21 1339 (!) 141/81     Pulse Rate 06/02/21 1339 87     Resp 06/02/21 1339 20     Temp 06/02/21 1339 98.4 F (36.9 C)     Temp Source 06/02/21 1339 Oral     SpO2 06/02/21 1339 98 %     Weight --      Height --      Head Circumference --      Peak Flow --  Pain Score 06/02/21 1342 0     Pain Loc --      Pain Edu? --      Excl. in [place]? --    No data found.  Updated Vital Signs BP (!) 141/81 (BP Location: Left Arm)   Pulse 87   Temp 98.4 F (36.9 C) (Oral)   Resp 20   LMP  (LMP Unknown)   SpO2 98%   Visual Acuity Right Eye Distance:   Left Eye Distance:   Bilateral Distance:    Right Eye Near:   Left Eye Near:    Bilateral Near:     Physical Exam Constitutional:      Appearance: Normal appearance. She is normal weight.  Eyes:     Extraocular Movements: Extraocular movements intact.  Pulmonary:     Effort: Pulmonary effort is normal.  Genitourinary:    Comments: Deferred self collect vaginal swab  Skin:    General: Skin is warm and dry.  Neurological:     Mental Status: She is alert and oriented to person, place, and time. Mental status is at baseline.  Psychiatric:        Mood and Affect: Mood normal.         Behavior: Behavior normal.     UC Treatments / Results  Labs (all labs ordered are listed, but only abnormal results are displayed) Labs Reviewed  CERVICOVAGINAL ANCILLARY ONLY    EKG   Radiology No results found.  Procedures Procedures (including critical care time)  Medications Ordered in UC Medications - No data to display  Initial Impression / Assessment and Plan / UC Course  I have reviewed the triage vital signs and the nursing notes.  Pertinent labs & imaging results that were available during my care of the patient were reviewed by me and considered in my medical decision making (see chart for details).  Vaginal odor  We will treat prophylactically for BV, discussed with patient, metronidazole 500 mg twice daily for 7 days prescribed, advised against use of alcohol while taking medication, STI screening pending, will treat per protocol, advised abstinence until lab results, treatment is complete and all symptoms have resolved, may follow-up with urgent care as needed Final Clinical Impressions(s) / UC Diagnoses   Final diagnoses:  None   Discharge Instructions   None    ED Prescriptions   None    PDMP not reviewed this encounter.   Hans Eden, NP 06/02/21 1426

## 2021-06-03 LAB — CERVICOVAGINAL ANCILLARY ONLY
Bacterial Vaginitis (gardnerella): POSITIVE — AB
Candida Glabrata: NEGATIVE
Candida Vaginitis: NEGATIVE
Chlamydia: NEGATIVE
Comment: NEGATIVE
Comment: NEGATIVE
Comment: NEGATIVE
Comment: NEGATIVE
Comment: NEGATIVE
Comment: NORMAL
Neisseria Gonorrhea: NEGATIVE
Trichomonas: NEGATIVE

## 2021-11-13 ENCOUNTER — Encounter (HOSPITAL_COMMUNITY): Payer: Self-pay | Admitting: Emergency Medicine

## 2021-11-13 ENCOUNTER — Emergency Department (HOSPITAL_COMMUNITY)
Admission: EM | Admit: 2021-11-13 | Discharge: 2021-11-13 | Disposition: A | Discharge status: Home / Self Care | Payer: Medicaid Other | Attending: Emergency Medicine | Admitting: Emergency Medicine

## 2021-11-13 ENCOUNTER — Emergency Department (HOSPITAL_COMMUNITY): Payer: Medicaid Other

## 2021-11-13 DIAGNOSIS — Y92410 Unspecified street and highway as the place of occurrence of the external cause: Secondary | ICD-10-CM | POA: Insufficient documentation

## 2021-11-13 DIAGNOSIS — M545 Low back pain, unspecified: Secondary | ICD-10-CM | POA: Insufficient documentation

## 2021-11-13 LAB — I-STAT BETA HCG BLOOD, ED (MC, WL, AP ONLY): I-stat hCG, quantitative: <5 m[IU]/mL (ref <5)

## 2021-11-13 MED ORDER — METHOCARBAMOL 500 MG PO TABS: 500.0000 mg | ORAL_TABLET | Freq: Two times a day (BID) | ORAL | 0 refills | Status: DC
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
## 2021-11-13 NOTE — Discharge Instructions (Signed)
It was a pleasure taking care of you today! ? ?Your imaging in the ED was negative for fracture or dislocations. You will feel more sore in the morning. You are prescribed Robaxin (muscle relaxer). Do not drive or operate heavy machinery while taking the muscle relaxer. You may take over the counter 600 mg Ibuprofen every 6 hours or 1,000 mg Tylenol every 6 hours as needed for pain. You may apply ice or heat to affected area for up to 15 minutes at a time. Ensure to place a barrier between your skin and the ice/heat. Return to the Emergency Department if you are experiencing increasing/worsening back pain, urinating or defecating on yourself without knowing, fever, or worsening symptoms.  ?

## 2022-08-31 ENCOUNTER — Other Ambulatory Visit (HOSPITAL_COMMUNITY): Payer: Self-pay

## 2022-08-31 MED ORDER — AMOXICILLIN 500 MG PO CAPS
500.0000 mg | ORAL_CAPSULE | Freq: Three times a day (TID) | ORAL | 0 refills | Status: DC
  Filled 2022-08-31: qty 21, 7d supply, fill #0

## 2022-08-31 MED ORDER — HYDROCODONE-ACETAMINOPHEN 5-325 MG PO TABS
1.0000 | ORAL_TABLET | ORAL | 0 refills | Status: DC | PRN
  Filled 2022-08-31: qty 8, 2d supply, fill #0

## 2023-02-15 ENCOUNTER — Ambulatory Visit (HOSPITAL_COMMUNITY)
Admission: EM | Admit: 2023-02-15 | Discharge: 2023-02-15 | Disposition: A | Discharge status: Home / Self Care | Payer: Medicaid Other | Attending: Nurse Practitioner | Admitting: Nurse Practitioner

## 2023-02-15 ENCOUNTER — Encounter (HOSPITAL_COMMUNITY): Payer: Self-pay

## 2023-02-15 DIAGNOSIS — N898 Other specified noninflammatory disorders of vagina: Secondary | ICD-10-CM | POA: Diagnosis not present

## 2023-02-15 LAB — POCT URINALYSIS DIP (MANUAL ENTRY)
Bilirubin, UA: NEGATIVE
Blood, UA: NEGATIVE
Glucose, UA: NEGATIVE mg/dL
Ketones, POC UA: NEGATIVE mg/dL
Nitrite, UA: NEGATIVE
Protein Ur, POC: NEGATIVE mg/dL
Spec Grav, UA: >=1.03 — AB (ref 1.010–1.025)
Urobilinogen, UA: 0.2 E.U./dL
pH, UA: 5.5 (ref 5.0–8.0)

## 2023-02-15 LAB — HIV ANTIBODY (ROUTINE TESTING W REFLEX): HIV Screen 4th Generation wRfx: NONREACTIVE

## 2023-02-15 MED ORDER — FLUCONAZOLE 150 MG PO TABS: 150.0000 mg | ORAL_TABLET | Freq: Once | ORAL | 0 refills | Status: AC

## 2023-02-15 NOTE — ED Provider Notes (Signed)
[facility]    CSN: [id number] Arrival date & time: 02/15/23  1035      History   Chief Complaint Chief Complaint  Patient presents with   Vaginal Itching    HPI Kim Hudson is a 32 y.o. female.   Patient presents today with 2-day history of vaginal itching.  She denies vaginal discharge, rashes, sores, or lesions on her genitalia.  No abdominal pain, flank pain, fever, nausea/vomiting.  She has burning with urination when the urine touches her vagina.  No increased urinary frequency or urgency.  Recently did use a new vaginal soap.  No known exposures to STI.  Reports last time she had intercourse, protection was used.  No concern for STI today, however is willing to have testing.  Reports when she performed the vaginal swab a few minutes ago, she saw white clumps on the Q tip.      Past Medical History:  Diagnosis Date   Back pain    GERD (gastroesophageal reflux disease)    Maternal obesity syndrome, antepartum, third trimester 03/03/2017   Obese    SVD (spontaneous vaginal delivery) 03/04/2017    Patient Active Problem List   Diagnosis Date Noted   Normal pregnancy in multigravida in third trimester 03/04/2017   SVD (spontaneous vaginal delivery) 03/04/2017   Maternal obesity syndrome, antepartum, third trimester 03/03/2017    Past Surgical History:  Procedure Laterality Date   NO PAST SURGERIES      OB History     Gravida  1   Para  1   Term  1   Preterm  0   AB  0   Living  1      SAB  0   IAB  0   Ectopic  0   Multiple  0   Live Births  1            Home Medications    Prior to Admission medications   Medication Sig Start Date End Date Taking? Authorizing Provider  fluconazole (DIFLUCAN) 150 MG tablet Take 1 tablet (150 mg total) by mouth once for 1 dose. 02/15/23 02/15/23 Yes Valentino Nose, NP  omeprazole (PRILOSEC) 20 MG capsule Take 1 po BID x 2 weeks then once a day Patient not taking: Reported on 06/25/2014  04/26/14 05/05/15  Devoria Albe, MD    Family History Family History  Problem Relation Age of Onset   Hypertension Mother     Social History Social History   Tobacco Use   Smoking status: Some Days    Types: Cigars   Smokeless tobacco: Never  Substance Use Topics   Alcohol use: No   Drug use: No     Allergies   Patient has no known allergies.   Review of Systems Review of Systems Per HPI  Physical Exam Triage Vital Signs ED Triage Vitals [02/15/23 1136]  Encounter Vitals Group     BP (!) 149/87     Systolic BP Percentile      Diastolic BP Percentile      Pulse Rate 87     Resp 18     Temp 98.4 F (36.9 C)     Temp Source Oral     SpO2 97 %     Weight      Height      Head Circumference      Peak Flow      Pain Score 0     Pain Loc  Pain Education      Exclude from Growth Chart    No data found.  Updated Vital Signs BP (!) 149/87 (BP Location: Left Arm)   Pulse 87   Temp 98.4 F (36.9 C) (Oral)   Resp 18   LMP 01/21/2023   SpO2 97%   Breastfeeding No   Visual Acuity Right Eye Distance:   Left Eye Distance:   Bilateral Distance:    Right Eye Near:   Left Eye Near:    Bilateral Near:     Physical Exam Vitals and nursing note reviewed.  Constitutional:      General: She is not in acute distress.    Appearance: She is not toxic-appearing.  Pulmonary:     Effort: Pulmonary effort is normal. No respiratory distress.  Abdominal:     General: Abdomen is flat. Bowel sounds are normal. There is no distension.     Palpations: Abdomen is soft. There is no mass.     Tenderness: There is no abdominal tenderness. There is no right CVA tenderness, left CVA tenderness or guarding.  Genitourinary:    Comments: Deferred - self swab performed by patient Skin:    General: Skin is warm and dry.     Coloration: Skin is not jaundiced or pale.     Findings: No erythema.  Neurological:     Mental Status: She is alert and oriented to person, place, and  time.     Motor: No weakness.     Gait: Gait normal.  Psychiatric:        Behavior: Behavior is cooperative.      UC Treatments / Results  Labs (all labs ordered are listed, but only abnormal results are displayed) Labs Reviewed  POCT URINALYSIS DIP (MANUAL ENTRY) - Abnormal; Notable for the following components:      Result Value   Spec Grav, UA >=1.030 (*)    Leukocytes, UA Small (1+) (*)    All other components within normal limits  HIV ANTIBODY (ROUTINE TESTING W REFLEX)  RPR  CERVICOVAGINAL ANCILLARY ONLY    EKG   Radiology No results found.  Procedures Procedures (including critical care time)  Medications Ordered in UC Medications - No data to display  Initial Impression / Assessment and Plan / UC Course  I have reviewed the triage vital signs and the nursing notes.  Pertinent labs & imaging results that were available during my care of the patient were reviewed by me and considered in my medical decision making (see chart for details).   Patient is well-appearing, normotensive, afebrile, not tachycardic, not tachypneic, oxygenating well on room air.    1. Vaginal itching Self swab is pending Suspicious for yeast infection, will treat with fluconazole once, treat as indicated if anything else positive UA today is concentrated with small leuks, not suspicious for acute UTI HIV and syphilis testing obtained per patient request Recommended condoms with every sexual encounter moving forward  The patient was given the opportunity to ask questions.  All questions answered to their satisfaction.  The patient is in agreement to this plan.    Final Clinical Impressions(s) / UC Diagnoses   Final diagnoses:  Vaginal itching     Discharge Instructions      As we discussed, the symptoms you describe to me are consistent with a yeast infection.  Please take the fluconazole as prescribed to treat it.  Will contact you if any of the testing from today comes up  positive for anything else  besides yeast infection.  Please increase water intake.    ED Prescriptions     Medication Sig Dispense Auth. Provider   fluconazole (DIFLUCAN) 150 MG tablet Take 1 tablet (150 mg total) by mouth once for 1 dose. 1 tablet Valentino Nose, NP      PDMP not reviewed this encounter.   Valentino Nose, NP 02/15/23 1235

## 2023-02-15 NOTE — ED Triage Notes (Addendum)
Pt c/o vaginal irritation x2 days with burning on urination. States used a different soap. States not sexually active.

## 2023-02-15 NOTE — Discharge Instructions (Signed)
As we discussed, the symptoms you describe to me are consistent with a yeast infection.  Please take the fluconazole as prescribed to treat it.  Will contact you if any of the testing from today comes up positive for anything else besides yeast infection.  Please increase water intake.

## 2023-06-17 ENCOUNTER — Ambulatory Visit (INDEPENDENT_AMBULATORY_CARE_PROVIDER_SITE_OTHER): Payer: Medicaid Other | Admitting: Nurse Practitioner

## 2023-06-17 ENCOUNTER — Encounter: Payer: Self-pay | Admitting: Nurse Practitioner

## 2023-06-17 VITALS — BP 128/52 | HR 89 | Ht 67.5 in | Wt 361.8 lb

## 2023-06-17 DIAGNOSIS — Z13220 Encounter for screening for lipoid disorders: Secondary | ICD-10-CM | POA: Diagnosis not present

## 2023-06-17 DIAGNOSIS — E559 Vitamin D deficiency, unspecified: Secondary | ICD-10-CM | POA: Diagnosis not present

## 2023-06-17 DIAGNOSIS — N63 Unspecified lump in unspecified breast: Secondary | ICD-10-CM

## 2023-06-17 DIAGNOSIS — Z0000 Encounter for general adult medical examination without abnormal findings: Secondary | ICD-10-CM | POA: Diagnosis not present

## 2023-06-17 MED ORDER — MELOXICAM 7.5 MG PO TABS: 7.5000 mg | ORAL_TABLET | Freq: Every day | ORAL | 0 refills | Status: DC

## 2023-06-17 NOTE — Patient Instructions (Addendum)
1. Large mass of breast  - Ambulatory referral to Plastic Surgery  2. Vitamin D deficiency  - Vitamin D, 25-hydroxy  3. Lipid screening  - Lipid Panel  4. Routine health maintenance  - CBC - Comprehensive metabolic panel   Follow up:  Follow up in 3 months

## 2023-06-17 NOTE — Progress Notes (Unsigned)
Subjective   Patient ID: Kim Hudson, female    DOB: 1991-03-25, 32 y.o.   MRN: [id number]  Chief Complaint  Patient presents with   New Patient (Initial Visit)    Patient states that she wants to also talk about breast reduction. She also stated that when she is on her menstrual she has a lot of pain from hips down the leg     Back Pain    Patient stated that when she had her child, the back pain has worsened     Referring provider: [facility], [facility]*  Kim Hudson is a 32 y.o. female with Past Medical History: No date: Back pain No date: GERD (gastroesophageal reflux disease) 03/03/2017: Maternal obesity syndrome, antepartum, third trimester No date: Obese 03/04/2017: SVD (spontaneous vaginal delivery)   HPI  Patient presents today to establish care.  Patient would like a referral to plastic surgery for breast reduction.  She does complain of back pain and states that she has noticed this since having her last child several years ago.  We discussed that the back pain could be a result of large breasts as well.  Patient would also like labs for physical today. Denies f/c/s, n/v/d, hemoptysis, PND, leg swelling Denies chest pain or edema    No Known Allergies   There is no immunization history on file for this patient.  Tobacco History: Social History   Tobacco Use  Smoking Status Some Days   Types: Cigars  Smokeless Tobacco Never   Ready to quit: Not Answered Counseling given: Not Answered   Outpatient Encounter Medications as of 06/17/2023  Medication Sig   meloxicam (MOBIC) 7.5 MG tablet Take 1 tablet (7.5 mg total) by mouth daily.   [DISCONTINUED] omeprazole (PRILOSEC) 20 MG capsule Take 1 po BID x 2 weeks then once a day (Patient not taking: Reported on 06/25/2014)   No facility-administered encounter medications on file as of 06/17/2023.    Review of Systems  Review of Systems  Constitutional: Negative.   HENT: Negative.    Cardiovascular:  Negative.   Gastrointestinal: Negative.   Musculoskeletal:  Positive for back pain.  Allergic/Immunologic: Negative.   Neurological: Negative.   Psychiatric/Behavioral: Negative.       Objective:   BP (!) 128/52 (BP Location: Left Arm, Patient Position: Sitting, Cuff Size: Large)   Pulse 89   Ht 5' 7.5" (1.715 m)   Wt (!) 361 lb 12.8 oz (164.1 kg)   LMP 06/17/2023   SpO2 97%   BMI 55.83 kg/m   Wt Readings from Last 5 Encounters:  06/17/23 (!) 361 lb 12.8 oz (164.1 kg)  03/04/17 (!) 365 lb (165.6 kg)  08/16/16 300 lb (136.1 kg)  03/15/16 (!) 336 lb (152.4 kg)  10/16/15 (!) 336 lb 3 oz (152.5 kg)     Physical Exam Vitals and nursing note reviewed.  Constitutional:      General: She is not in acute distress.    Appearance: She is well-developed.  Cardiovascular:     Rate and Rhythm: Regular rhythm.  Pulmonary:     Effort: Pulmonary effort is normal.     Breath sounds: Normal breath sounds.  Neurological:     Mental Status: She is alert and oriented to person, place, and time.       Assessment & Plan:   Large mass of breast -     Ambulatory referral to Plastic Surgery  Vitamin D deficiency -     VITAMIN D 25 Hydroxy (Vit-D  Deficiency, Fractures)  Lipid screening -     Lipid panel  Routine health maintenance -     CBC -     Comprehensive metabolic panel  Other orders -     Meloxicam; Take 1 tablet (7.5 mg total) by mouth daily.  Dispense: 30 tablet; Refill: 0     Return in about 6 months (around 12/16/2023).   Ivonne Andrew, NP 06/21/2023

## 2023-06-18 LAB — COMPREHENSIVE METABOLIC PANEL
ALT: 26 [IU]/L (ref 0–32)
AST: 21 [IU]/L (ref 0–40)
Albumin: 4 g/dL (ref 3.9–4.9)
Alkaline Phosphatase: 123 [IU]/L — ABNORMAL HIGH (ref 44–121)
BUN/Creatinine Ratio: 11 (ref 9–23)
BUN: 9 mg/dL (ref 6–20)
Bilirubin Total: <0.2 mg/dL (ref 0.0–1.2)
CO2: 25 mmol/L (ref 20–29)
Calcium: 9.2 mg/dL (ref 8.7–10.2)
Chloride: 102 mmol/L (ref 96–106)
Creatinine, Ser: 0.79 mg/dL (ref 0.57–1.00)
Globulin, Total: 2.7 g/dL (ref 1.5–4.5)
Glucose: 81 mg/dL (ref 70–99)
Potassium: 4.3 mmol/L (ref 3.5–5.2)
Sodium: 138 mmol/L (ref 134–144)
Total Protein: 6.7 g/dL (ref 6.0–8.5)
eGFR: 102 mL/min/{1.73_m2} (ref >59)

## 2023-06-18 LAB — CBC
Hematocrit: 38.3 % (ref 34.0–46.6)
Hemoglobin: 12.2 g/dL (ref 11.1–15.9)
MCH: 26.2 pg — ABNORMAL LOW (ref 26.6–33.0)
MCHC: 31.9 g/dL (ref 31.5–35.7)
MCV: 82 fL (ref 79–97)
Platelets: 314 10*3/uL (ref 150–450)
RBC: 4.66 x10E6/uL (ref 3.77–5.28)
RDW: 13.6 % (ref 11.7–15.4)
WBC: 6.2 10*3/uL (ref 3.4–10.8)

## 2023-06-18 LAB — LIPID PANEL
Chol/HDL Ratio: 4.7 {ratio} — ABNORMAL HIGH (ref 0.0–4.4)
Cholesterol, Total: 201 mg/dL — ABNORMAL HIGH (ref 100–199)
HDL: 43 mg/dL (ref >39)
LDL Chol Calc (NIH): 139 mg/dL — ABNORMAL HIGH (ref 0–99)
Triglycerides: 107 mg/dL (ref 0–149)
VLDL Cholesterol Cal: 19 mg/dL (ref 5–40)

## 2023-06-18 LAB — VITAMIN D 25 HYDROXY (VIT D DEFICIENCY, FRACTURES): Vit D, 25-Hydroxy: 12.7 ng/mL — ABNORMAL LOW (ref 30.0–100.0)

## 2023-06-20 ENCOUNTER — Other Ambulatory Visit: Payer: Self-pay | Admitting: Nurse Practitioner

## 2023-06-20 DIAGNOSIS — E559 Vitamin D deficiency, unspecified: Secondary | ICD-10-CM

## 2023-06-20 MED ORDER — VITAMIN D (ERGOCALCIFEROL) 1.25 MG (50000 UNIT) PO CAPS: 50000.0000 [IU] | ORAL_CAPSULE | ORAL | 0 refills | Status: DC

## 2023-06-21 ENCOUNTER — Encounter: Payer: Self-pay | Admitting: Nurse Practitioner

## 2023-07-19 ENCOUNTER — Other Ambulatory Visit: Payer: Self-pay | Admitting: Nurse Practitioner

## 2023-07-29 ENCOUNTER — Ambulatory Visit (HOSPITAL_COMMUNITY): Payer: Medicaid Other

## 2023-08-26 ENCOUNTER — Ambulatory Visit: Payer: Medicaid Other | Admitting: Plastic Surgery

## 2023-08-26 ENCOUNTER — Encounter: Payer: Self-pay | Admitting: Plastic Surgery

## 2023-08-26 VITALS — BP 130/86 | HR 83 | Ht 67.0 in | Wt 361.8 lb

## 2023-08-26 DIAGNOSIS — M546 Pain in thoracic spine: Secondary | ICD-10-CM

## 2023-08-26 DIAGNOSIS — M549 Dorsalgia, unspecified: Secondary | ICD-10-CM | POA: Insufficient documentation

## 2023-08-26 DIAGNOSIS — M545 Low back pain, unspecified: Secondary | ICD-10-CM | POA: Diagnosis not present

## 2023-08-26 DIAGNOSIS — Z6841 Body Mass Index (BMI) 40.0 and over, adult: Secondary | ICD-10-CM

## 2023-08-26 DIAGNOSIS — G8929 Other chronic pain: Secondary | ICD-10-CM

## 2023-08-26 DIAGNOSIS — M542 Cervicalgia: Secondary | ICD-10-CM | POA: Diagnosis not present

## 2023-08-26 DIAGNOSIS — N62 Hypertrophy of breast: Secondary | ICD-10-CM | POA: Insufficient documentation

## 2023-08-26 NOTE — Progress Notes (Signed)
Patient ID: Kim Hudson, female    DOB: May 23, 1991, 33 y.o.   MRN: [id number]   Chief Complaint  Patient presents with   Consult    Mammary Hyperplasia: The patient is a 33 y.o. female with a history of mammary hyperplasia for several years.  She has extremely large breasts causing symptoms that include the following: Back pain in the upper and lower back, including neck pain. She pulls or pins her bra straps to provide better lift and relief of the pressure and pain. She notices relief by holding her breast up manually.  Her shoulder straps cause grooves and pain and pressure that requires padding for relief. Pain medication is sometimes required with motrin and tylenol.  Activities that are hindered by enlarged breasts include: exercise and running.  She has tried supportive clothing as well as fitted bras without improvement.  Her breasts are extremely large and fairly symmetric.  She has hyperpigmentation of the inframammary area on both sides.  The sternal to nipple distance on the right is 50 cm and the left is 50 cm.  The IMF distance is 27 cm.  She is 5 feet 7 inches tall and weighs 361 pounds.  The BMI = 56.5 kg/m.  Preoperative bra size = K cup.  The estimated excess breast tissue to be removed at the time of surgery = 1800 grams on the left and 1800 grams on the right.  Mammogram history:  no.  Family history of breast cancer:  no.  Tobacco use:  no.   The patient expresses the desire to pursue surgical intervention.  I think the patient     Review of Systems  Constitutional: Negative.   Eyes: Negative.   Respiratory: Negative.    Cardiovascular: Negative.   Gastrointestinal: Negative.   Endocrine: Negative.   Genitourinary: Negative.   Musculoskeletal:  Positive for back pain and neck pain.  Skin:  Positive for rash.    Past Medical History:  Diagnosis Date   Back pain    GERD (gastroesophageal reflux disease)    Maternal obesity syndrome, antepartum, third  trimester 03/03/2017   Obese    SVD (spontaneous vaginal delivery) 03/04/2017    Past Surgical History:  Procedure Laterality Date   NO PAST SURGERIES        Current Outpatient Medications:    meloxicam (MOBIC) 7.5 MG tablet, TAKE 1 TABLET BY MOUTH EVERY DAY, Disp: 30 tablet, Rfl: 0   Vitamin D, Ergocalciferol, (DRISDOL) 1.25 MG (50000 UNIT) CAPS capsule, Take 1 capsule (50,000 Units total) by mouth every 7 (seven) days., Disp: 5 capsule, Rfl: 0   Objective:   Vitals:   08/26/23 1115  BP: 130/86  Pulse: 83  SpO2: 98%    Physical Exam Vitals reviewed.  Constitutional:      Appearance: Normal appearance.  Cardiovascular:     Rate and Rhythm: Normal rate.     Pulses: Normal pulses.  Pulmonary:     Effort: Pulmonary effort is normal.  Abdominal:     General: There is no distension.     Palpations: Abdomen is soft.     Tenderness: There is no abdominal tenderness.  Skin:    General: Skin is warm.  Neurological:     Mental Status: She is alert and oriented to person, place, and time.  Psychiatric:        Mood and Affect: Mood normal.        Behavior: Behavior normal.  Thought Content: Thought content normal.        Judgment: Judgment normal.     Assessment & Plan:  Symptomatic mammary hypertrophy  Chronic bilateral thoracic back pain  Neck pain  The procedure the patient selected and that was best for the patient was discussed. The risk were discussed and include but not limited to the following:  Breast asymmetry, fluid accumulation, firmness of the breast, inability to breast feed, loss of nipple or areola, skin loss, change in skin and nipple sensation, fat necrosis of the breast tissue, bleeding, infection and healing delay.  There are risks of anesthesia and injury to nerves or blood vessels.  Allergic reaction to tape, suture and skin glue are possible.  There will be swelling.  Any of these can lead to the need for revisional surgery which is not included  in this surgery.  A breast reduction has potential to interfere with diagnostic procedures in the future.  This procedure is best done when the breast is fully developed.  Changes in the breast will continue to occur over time: pregnancy, weight gain or weigh loss. No guarantees are given for a certain bra or breast size.    Total time: 40 minutes. This includes time spent with the patient during the visit as well as time spent before and after the visit reviewing the chart, documenting the encounter, ordering pertinent studies and literature for the patient.    Would benefit from bilateral breast reduction with liposuction.  She will definitely need an amputation technique.  She will likely not be able to breast-feed in the future will not have sensation.  She will also have some color changes based upon how she heals due to the skin grafted areola.  Pictures were obtained of the patient and placed in the chart with the patient's or guardian's permission.   Alena Bills Kanen Mottola, DO

## 2023-09-08 ENCOUNTER — Encounter: Payer: Self-pay | Admitting: Surgical

## 2023-09-08 ENCOUNTER — Ambulatory Visit (INDEPENDENT_AMBULATORY_CARE_PROVIDER_SITE_OTHER): Payer: Medicaid Other | Admitting: Surgical

## 2023-09-08 VITALS — BP 139/85 | HR 81

## 2023-09-08 DIAGNOSIS — M542 Cervicalgia: Secondary | ICD-10-CM

## 2023-09-08 DIAGNOSIS — M546 Pain in thoracic spine: Secondary | ICD-10-CM

## 2023-09-08 DIAGNOSIS — G8929 Other chronic pain: Secondary | ICD-10-CM

## 2023-09-08 DIAGNOSIS — N62 Hypertrophy of breast: Secondary | ICD-10-CM

## 2023-09-08 NOTE — H&P (View-Only) (Signed)
 Patient ID: Kim Hudson, female    DOB: 1990/12/01, 33 y.o.   MRN: [id number]  Chief Complaint  Patient presents with   Pre-op Exam      ICD-10-CM   1. Symptomatic mammary hypertrophy  N62     2. Chronic bilateral thoracic back pain  M54.6    G89.29     3. Neck pain  M54.2      History of Present Illness: Kim Hudson is a 33 y.o.  female  with a history of macromastia.  She presents for preoperative evaluation for upcoming procedure, Bilateral Breast Reduction with liposuction, scheduled for 09/19/2023 with Dr.  Ulice Bold  No history of DVT/PE.  No family history of DVT/PE.  No family or personal history of bleeding or clotting disorders.  Patient is not currently taking any blood thinners.  No history of CVA/MI.   Summary of Previous Visit: STN is 50 cm bilaterally, IMF is 27 cm.  5 feet 7 inches tall, 361 pounds.  BMI is 56.5.  Bra size equals K cup No mammographic history.  No family history of breast cancer.  No tobacco use.  Estimated excess breast tissue to be removed at time of surgery: 1800 grams  Job: Not working  PMH Significant for: Patient reports she takes meloxicam for back pain.  She denies any other medical history.  She denies any heart or lung disease.  She reports she has been feeling well lately with no recent changes to her health.  She is aware that she will require free nipple graft breast reduction surgery.  She has no history of anesthesia, but her mother reports she has had anesthesia with no known family of complications related to anesthesia.  She is here today with her mother.  Past Medical History: Allergies: No Known Allergies  Current Medications:  Current Outpatient Medications:    meloxicam (MOBIC) 7.5 MG tablet, TAKE 1 TABLET BY MOUTH EVERY DAY, Disp: 30 tablet, Rfl: 0   Vitamin D, Ergocalciferol, (DRISDOL) 1.25 MG (50000 UNIT) CAPS capsule, Take 1 capsule (50,000 Units total) by mouth every 7 (seven) days., Disp: 5 capsule, Rfl:  0  Past Medical Problems: Past Medical History:  Diagnosis Date   Back pain    GERD (gastroesophageal reflux disease)    Maternal obesity syndrome, antepartum, third trimester 03/03/2017   Obese    SVD (spontaneous vaginal delivery) 03/04/2017    Past Surgical History: Past Surgical History:  Procedure Laterality Date   NO PAST SURGERIES      Social History: Social History   Socioeconomic History   Marital status: Single    Spouse name: Not on file   Number of children: Not on file   Years of education: Not on file   Highest education level: Not on file  Occupational History   Not on file  Tobacco Use   Smoking status: Some Days    Types: Cigars   Smokeless tobacco: Never  Substance and Sexual Activity   Alcohol use: No   Drug use: No   Sexual activity: Yes    Birth control/protection: None  Other Topics Concern   Not on file  Social History Narrative   Not on file   Social Drivers of Health   Financial Resource Strain: Not on file  Food Insecurity: Not on file  Transportation Needs: Not on file  Physical Activity: Not on file  Stress: Not on file  Social Connections: Not on file  Intimate Partner Violence: Not on file  Family History: Family History  Problem Relation Age of Onset   Hypertension Mother     Review of Systems: Review of Systems  Constitutional: Negative.   Respiratory: Negative.    Cardiovascular: Negative.   Gastrointestinal: Negative.   Musculoskeletal:  Positive for back pain.  Neurological: Negative.     Physical Exam: Vital Signs BP 139/85 (BP Location: Left Arm, Patient Position: Sitting, Cuff Size: Large)   Pulse 81   SpO2 98%   Physical Exam Constitutional:      General: Not in acute distress.    Appearance: Normal appearance. Not ill-appearing.  Patient is obese HENT:     Head: Normocephalic and atraumatic.  Eyes:     Pupils: Pupils are equal, round Neck:     Musculoskeletal: Normal range of motion.   Cardiovascular:     Rate and Rhythm: Normal rate    Pulses: Normal pulses.  Pulmonary:     Effort: Pulmonary effort is normal. No respiratory distress.  Musculoskeletal: Normal range of motion.  Skin:    General: Skin is warm and dry.     Findings: No erythema or rash.  Neurological:     General: No focal deficit present.     Mental Status: Alert and oriented to person, place, and time. Mental status is at baseline.     Motor: No weakness.  Psychiatric:        Mood and Affect: Mood normal.        Behavior: Behavior normal.    Assessment/Plan: The patient is scheduled for bilateral breast reduction with liposuction with Dr. Ulice Bold.  Risks, benefits, and alternatives of procedure discussed, questions answered and consent obtained.    Smoking Status: Patient denies smoking; Counseling Given?  N/A Last Mammogram: No mammogram history due to age  Caprini Score: 4; Risk Factors include: Age, BMI > 40, and length of planned surgery. Recommendation for mechanical prophylaxis. Encourage early ambulation.  Strictly discussed risks of DVT with patient, discussed with patient that she is at an elevated risk of DVT/PE due to her BMI.  We discussed the importance of hydration postoperatively, ambulation frequently, at a minimum once every hour throughout the day.  We also discussed the importance of staying active after surgery, but staying within the restrictions of no lifting greater than 15 to 20 pounds.  Pictures obtained: @consult   Post-op Rx sent to pharmacy:  No prescription sent at this time, we will discuss patient's case with Dr. Ulice Bold due to elevated BMI.  Patient was provided with the breast reduction and General Surgical Risk consent document and Pain Medication Agreement prior to their appointment.  They had adequate time to read through the risk consent documents and Pain Medication Agreement. We also discussed them in person together during this preop appointment. All of  their questions were answered to their satisfaction.  Recommended calling if they have any further questions.  Risk consent form and Pain Medication Agreement to be scanned into patient's chart.  The risk that can be encountered with breast reduction were discussed and include the following but not limited to these:  Breast asymmetry, fluid accumulation, firmness of the breast, inability to breast feed, loss of nipple or areola, skin loss, decrease or no nipple sensation, fat necrosis of the breast tissue, bleeding, infection, healing delay.  There are risks of anesthesia, changes to skin sensation and injury to nerves or blood vessels.  The muscle can be temporarily or permanently injured.  You may have an allergic reaction to tape, suture, glue,  blood products which can result in skin discoloration, swelling, pain, skin lesions, poor healing.  Any of these can lead to the need for revisonal surgery or stage procedures.  A reduction has potential to interfere with diagnostic procedures.  Nipple or breast piercing can increase risks of infection.  This procedure is best done when the breast is fully developed.  Changes in the breast will continue to occur over time.  Pregnancy can alter the outcomes of previous breast reduction surgery, weight gain and weigh loss can also effect the long term appearance.   We discussed the possibility of amputation/free nipple graft technique due to the length of her STN.  She is understanding of the possibility that we would need to transition from a pedicle technique to a free nipple graft technique intraoperatively.  We discussed the risks associated with free nipple graft breast reductions, including but not limited to failure of the graft, partial loss of the graft, loss of sensation of bilateral nipple areola, complete loss of the nipple areola graft, inability to breast-feed, postoperative wounds, ongoing wound care.  We also discussed the risks associated with the  pedicle technique.  We discussed that with the pedicle technique she could develop nipple areolar necrosis which would result in loss of the nipple, this would also result in ongoing wound care and possible changes in the shape of her breast.   We specifically discussed that she is at an increased risk of postoperative complications including the possibility of large wounds requiring additional surgery, bleeding, infection, failure of nipple areolar graft due to free nipple graft technique.  We discussed the possibility of DVT/PE after surgery, we discussed the importance of ambulation.   Electronically signed by: Kermit Balo Allix Blomquist, PA-C 09/08/2023 1:21 PM

## 2023-09-08 NOTE — Progress Notes (Signed)
 Patient ID: Kim Hudson, female    DOB: 1990/12/01, 33 y.o.   MRN: [id number]  Chief Complaint  Patient presents with   Pre-op Exam      ICD-10-CM   1. Symptomatic mammary hypertrophy  N62     2. Chronic bilateral thoracic back pain  M54.6    G89.29     3. Neck pain  M54.2      History of Present Illness: Kim Hudson is a 33 y.o.  female  with a history of macromastia.  She presents for preoperative evaluation for upcoming procedure, Bilateral Breast Reduction with liposuction, scheduled for 09/19/2023 with Dr.  Ulice Bold  No history of DVT/PE.  No family history of DVT/PE.  No family or personal history of bleeding or clotting disorders.  Patient is not currently taking any blood thinners.  No history of CVA/MI.   Summary of Previous Visit: STN is 50 cm bilaterally, IMF is 27 cm.  5 feet 7 inches tall, 361 pounds.  BMI is 56.5.  Bra size equals K cup No mammographic history.  No family history of breast cancer.  No tobacco use.  Estimated excess breast tissue to be removed at time of surgery: 1800 grams  Job: Not working  PMH Significant for: Patient reports she takes meloxicam for back pain.  She denies any other medical history.  She denies any heart or lung disease.  She reports she has been feeling well lately with no recent changes to her health.  She is aware that she will require free nipple graft breast reduction surgery.  She has no history of anesthesia, but her mother reports she has had anesthesia with no known family of complications related to anesthesia.  She is here today with her mother.  Past Medical History: Allergies: No Known Allergies  Current Medications:  Current Outpatient Medications:    meloxicam (MOBIC) 7.5 MG tablet, TAKE 1 TABLET BY MOUTH EVERY DAY, Disp: 30 tablet, Rfl: 0   Vitamin D, Ergocalciferol, (DRISDOL) 1.25 MG (50000 UNIT) CAPS capsule, Take 1 capsule (50,000 Units total) by mouth every 7 (seven) days., Disp: 5 capsule, Rfl:  0  Past Medical Problems: Past Medical History:  Diagnosis Date   Back pain    GERD (gastroesophageal reflux disease)    Maternal obesity syndrome, antepartum, third trimester 03/03/2017   Obese    SVD (spontaneous vaginal delivery) 03/04/2017    Past Surgical History: Past Surgical History:  Procedure Laterality Date   NO PAST SURGERIES      Social History: Social History   Socioeconomic History   Marital status: Single    Spouse name: Not on file   Number of children: Not on file   Years of education: Not on file   Highest education level: Not on file  Occupational History   Not on file  Tobacco Use   Smoking status: Some Days    Types: Cigars   Smokeless tobacco: Never  Substance and Sexual Activity   Alcohol use: No   Drug use: No   Sexual activity: Yes    Birth control/protection: None  Other Topics Concern   Not on file  Social History Narrative   Not on file   Social Drivers of Health   Financial Resource Strain: Not on file  Food Insecurity: Not on file  Transportation Needs: Not on file  Physical Activity: Not on file  Stress: Not on file  Social Connections: Not on file  Intimate Partner Violence: Not on file  Family History: Family History  Problem Relation Age of Onset   Hypertension Mother     Review of Systems: Review of Systems  Constitutional: Negative.   Respiratory: Negative.    Cardiovascular: Negative.   Gastrointestinal: Negative.   Musculoskeletal:  Positive for back pain.  Neurological: Negative.     Physical Exam: Vital Signs BP 139/85 (BP Location: Left Arm, Patient Position: Sitting, Cuff Size: Large)   Pulse 81   SpO2 98%   Physical Exam Constitutional:      General: Not in acute distress.    Appearance: Normal appearance. Not ill-appearing.  Patient is obese HENT:     Head: Normocephalic and atraumatic.  Eyes:     Pupils: Pupils are equal, round Neck:     Musculoskeletal: Normal range of motion.   Cardiovascular:     Rate and Rhythm: Normal rate    Pulses: Normal pulses.  Pulmonary:     Effort: Pulmonary effort is normal. No respiratory distress.  Musculoskeletal: Normal range of motion.  Skin:    General: Skin is warm and dry.     Findings: No erythema or rash.  Neurological:     General: No focal deficit present.     Mental Status: Alert and oriented to person, place, and time. Mental status is at baseline.     Motor: No weakness.  Psychiatric:        Mood and Affect: Mood normal.        Behavior: Behavior normal.    Assessment/Plan: The patient is scheduled for bilateral breast reduction with liposuction with Dr. Ulice Bold.  Risks, benefits, and alternatives of procedure discussed, questions answered and consent obtained.    Smoking Status: Patient denies smoking; Counseling Given?  N/A Last Mammogram: No mammogram history due to age  Caprini Score: 4; Risk Factors include: Age, BMI > 40, and length of planned surgery. Recommendation for mechanical prophylaxis. Encourage early ambulation.  Strictly discussed risks of DVT with patient, discussed with patient that she is at an elevated risk of DVT/PE due to her BMI.  We discussed the importance of hydration postoperatively, ambulation frequently, at a minimum once every hour throughout the day.  We also discussed the importance of staying active after surgery, but staying within the restrictions of no lifting greater than 15 to 20 pounds.  Pictures obtained: @consult   Post-op Rx sent to pharmacy:  No prescription sent at this time, we will discuss patient's case with Dr. Ulice Bold due to elevated BMI.  Patient was provided with the breast reduction and General Surgical Risk consent document and Pain Medication Agreement prior to their appointment.  They had adequate time to read through the risk consent documents and Pain Medication Agreement. We also discussed them in person together during this preop appointment. All of  their questions were answered to their satisfaction.  Recommended calling if they have any further questions.  Risk consent form and Pain Medication Agreement to be scanned into patient's chart.  The risk that can be encountered with breast reduction were discussed and include the following but not limited to these:  Breast asymmetry, fluid accumulation, firmness of the breast, inability to breast feed, loss of nipple or areola, skin loss, decrease or no nipple sensation, fat necrosis of the breast tissue, bleeding, infection, healing delay.  There are risks of anesthesia, changes to skin sensation and injury to nerves or blood vessels.  The muscle can be temporarily or permanently injured.  You may have an allergic reaction to tape, suture, glue,  blood products which can result in skin discoloration, swelling, pain, skin lesions, poor healing.  Any of these can lead to the need for revisonal surgery or stage procedures.  A reduction has potential to interfere with diagnostic procedures.  Nipple or breast piercing can increase risks of infection.  This procedure is best done when the breast is fully developed.  Changes in the breast will continue to occur over time.  Pregnancy can alter the outcomes of previous breast reduction surgery, weight gain and weigh loss can also effect the long term appearance.   We discussed the possibility of amputation/free nipple graft technique due to the length of her STN.  She is understanding of the possibility that we would need to transition from a pedicle technique to a free nipple graft technique intraoperatively.  We discussed the risks associated with free nipple graft breast reductions, including but not limited to failure of the graft, partial loss of the graft, loss of sensation of bilateral nipple areola, complete loss of the nipple areola graft, inability to breast-feed, postoperative wounds, ongoing wound care.  We also discussed the risks associated with the  pedicle technique.  We discussed that with the pedicle technique she could develop nipple areolar necrosis which would result in loss of the nipple, this would also result in ongoing wound care and possible changes in the shape of her breast.   We specifically discussed that she is at an increased risk of postoperative complications including the possibility of large wounds requiring additional surgery, bleeding, infection, failure of nipple areolar graft due to free nipple graft technique.  We discussed the possibility of DVT/PE after surgery, we discussed the importance of ambulation.   Electronically signed by: Kermit Balo Allix Blomquist, PA-C 09/08/2023 1:21 PM

## 2023-09-14 ENCOUNTER — Other Ambulatory Visit (HOSPITAL_COMMUNITY): Payer: Self-pay

## 2023-09-14 ENCOUNTER — Other Ambulatory Visit: Payer: Self-pay | Admitting: Nurse Practitioner

## 2023-09-14 NOTE — Telephone Encounter (Signed)
 Please advise [facility]

## 2023-09-15 ENCOUNTER — Other Ambulatory Visit: Payer: Self-pay

## 2023-09-15 ENCOUNTER — Encounter (HOSPITAL_COMMUNITY): Payer: Self-pay | Admitting: Plastic Surgery

## 2023-09-15 MED ORDER — OXYCODONE HCL 5 MG PO TABS: 5.0000 mg | ORAL_TABLET | Freq: Four times a day (QID) | ORAL | 0 refills | Status: AC | PRN

## 2023-09-15 MED ORDER — ONDANSETRON HCL 4 MG PO TABS: 4.0000 mg | ORAL_TABLET | Freq: Three times a day (TID) | ORAL | 0 refills | Status: DC | PRN

## 2023-09-15 MED ORDER — CEPHALEXIN 500 MG PO CAPS: 500.0000 mg | ORAL_CAPSULE | Freq: Four times a day (QID) | ORAL | 0 refills | Status: AC

## 2023-09-15 NOTE — Progress Notes (Signed)
 PCP - Angus Seller Cardiologist -   PPM/ICD - denies Device Orders - n/a Rep Notified - n/a  Chest x-ray - denies EKG - denies Stress Test - denies ECHO - denies Cardiac Cath - denies  CPAP Denies  DM - denies  Blood Thinner Instructions: denies Aspirin Instructions: n/a  ERAS Protcol - clear liquids until 9:30  COVID TEST- n/a  Anesthesia review: no  Patient verbally denies any shortness of breath, fever, cough and chest pain during phone call   -------------  SDW INSTRUCTIONS given:  Your procedure is scheduled on September 19, 2023.  Report to [facility] Main Entrance "A" at 10:00 A.M., and check in at the Admitting office.  Call this number if you have problems the morning of surgery:  [phone]   Remember:  Do not eat after midnight the night before your surgery  You may drink clear liquids until 9:30 the morning of your surgery.   Clear liquids allowed are: Water, Non-Citrus Juices (without pulp), Carbonated Beverages, Clear Tea, Black Coffee Only, and Gatorade    Take these medicines the morning of surgery with A SIP OF WATER NONE  As of today, STOP taking any Aspirin (unless otherwise instructed by your surgeon) Aleve, Naproxen, Ibuprofen, Motrin, Advil, Goody's, BC's, all herbal medications, fish oil, and all vitamins.                      Do not wear jewelry, make up, or nail polish            Do not wear lotions, powders, perfumes/colognes, or deodorant.            Do not shave 48 hours prior to surgery.  Men may shave face and neck.            Do not bring valuables to the hospital.            [facility] is not responsible for any belongings or valuables.  Do NOT Smoke (Tobacco/Vaping) 24 hours prior to your procedure If you use a CPAP at night, you may bring all equipment for your overnight stay.   Contacts, glasses, dentures or bridgework may not be worn into surgery.      For patients admitted to the hospital, discharge time will be determined  by your treatment team.   Patients discharged the day of surgery will not be allowed to drive home, and someone needs to stay with them for 24 hours.    Special instructions:   [place]- Preparing For Surgery  Before surgery, you can play an important role. Because skin is not sterile, your skin needs to be as free of germs as possible. You can reduce the number of germs on your skin by washing with CHG (chlorahexidine gluconate) Soap before surgery.  CHG is an antiseptic cleaner which kills germs and bonds with the skin to continue killing germs even after washing.    Oral Hygiene is also important to reduce your risk of infection.  Remember - BRUSH YOUR TEETH THE MORNING OF SURGERY WITH YOUR REGULAR TOOTHPASTE  Please do not use if you have an allergy to CHG or antibacterial soaps. If your skin becomes reddened/irritated stop using the CHG.  Do not shave (including legs and underarms) for at least 48 hours prior to first CHG shower. It is OK to shave your face.  Please follow these instructions carefully.   Shower the NIGHT BEFORE SURGERY and the MORNING OF SURGERY with DIAL Soap.  Pat yourself dry with a CLEAN TOWEL.  Wear CLEAN PAJAMAS to bed the night before surgery  Place CLEAN SHEETS on your bed the night of your first shower and DO NOT SLEEP WITH PETS.   Day of Surgery: Please shower morning of surgery  Wear Clean/Comfortable clothing the morning of surgery Do not apply any deodorants/lotions.   Remember to brush your teeth WITH YOUR REGULAR TOOTHPASTE.   Questions were answered. Patient verbalized understanding of instructions.

## 2023-09-19 ENCOUNTER — Ambulatory Visit (HOSPITAL_COMMUNITY): Admitting: Anesthesiology

## 2023-09-19 ENCOUNTER — Other Ambulatory Visit: Payer: Self-pay

## 2023-09-19 ENCOUNTER — Ambulatory Visit (HOSPITAL_COMMUNITY)
Admission: RE | Admit: 2023-09-19 | Discharge: 2023-09-19 | Disposition: A | Discharge status: Home / Self Care | Payer: Medicaid Other | Attending: Plastic Surgery | Admitting: Plastic Surgery

## 2023-09-19 ENCOUNTER — Encounter (HOSPITAL_COMMUNITY)
Admission: RE | Disposition: A | Discharge status: Home / Self Care | Payer: Self-pay | Source: Home / Self Care | Attending: Plastic Surgery

## 2023-09-19 ENCOUNTER — Encounter (HOSPITAL_COMMUNITY): Payer: Self-pay | Admitting: Plastic Surgery

## 2023-09-19 DIAGNOSIS — E6689 Other obesity not elsewhere classified: Secondary | ICD-10-CM | POA: Diagnosis not present

## 2023-09-19 DIAGNOSIS — N62 Hypertrophy of breast: Secondary | ICD-10-CM

## 2023-09-19 DIAGNOSIS — F17290 Nicotine dependence, other tobacco product, uncomplicated: Secondary | ICD-10-CM | POA: Diagnosis not present

## 2023-09-19 DIAGNOSIS — M549 Dorsalgia, unspecified: Secondary | ICD-10-CM | POA: Insufficient documentation

## 2023-09-19 DIAGNOSIS — M542 Cervicalgia: Secondary | ICD-10-CM | POA: Insufficient documentation

## 2023-09-19 DIAGNOSIS — Z791 Long term (current) use of non-steroidal anti-inflammatories (NSAID): Secondary | ICD-10-CM | POA: Insufficient documentation

## 2023-09-19 DIAGNOSIS — Z6841 Body Mass Index (BMI) 40.0 and over, adult: Secondary | ICD-10-CM | POA: Insufficient documentation

## 2023-09-19 DIAGNOSIS — K219 Gastro-esophageal reflux disease without esophagitis: Secondary | ICD-10-CM | POA: Insufficient documentation

## 2023-09-19 HISTORY — PX: BREAST REDUCTION SURGERY: SHX8

## 2023-09-19 LAB — CBC
HCT: 41.1 % (ref 36.0–46.0)
Hemoglobin: 13.2 g/dL (ref 12.0–15.0)
MCH: 25.9 pg — ABNORMAL LOW (ref 26.0–34.0)
MCHC: 32.1 g/dL (ref 30.0–36.0)
MCV: 80.7 fL (ref 80.0–100.0)
Platelets: 290 10*3/uL (ref 150–400)
RBC: 5.09 MIL/uL (ref 3.87–5.11)
RDW: 13.5 % (ref 11.5–15.5)
WBC: 6 10*3/uL (ref 4.0–10.5)
nRBC: 0 % (ref 0.0–0.2)

## 2023-09-19 LAB — POCT PREGNANCY, URINE: Preg Test, Ur: NEGATIVE

## 2023-09-19 SURGERY — BREAST REDUCTION WITH LIPOSUCTION
Anesthesia: General | Site: Breast | Laterality: Bilateral

## 2023-09-19 MED ORDER — SODIUM CHLORIDE (PF) 0.9 % IJ SOLN
INTRAMUSCULAR | Status: DC | PRN
  Administered 2023-09-19: 30 mL

## 2023-09-19 MED ORDER — OXYCODONE HCL 5 MG PO TABS
ORAL_TABLET | ORAL | Status: AC
  Filled 2023-09-19: qty 1

## 2023-09-19 MED ORDER — PROPOFOL 10 MG/ML IV BOLUS
INTRAVENOUS | Status: DC | PRN
  Administered 2023-09-19: 200 mg via INTRAVENOUS

## 2023-09-19 MED ORDER — CHLORHEXIDINE GLUCONATE 0.12 % MT SOLN
15.0000 mL | Freq: Once | OROMUCOSAL | Status: AC
  Administered 2023-09-19: 15 mL via OROMUCOSAL
  Filled 2023-09-19: qty 15

## 2023-09-19 MED ORDER — CEFAZOLIN SODIUM-DEXTROSE 3-4 GM/150ML-% IV SOLN
3.0000 g | INTRAVENOUS | Status: DC
  Filled 2023-09-19: qty 150

## 2023-09-19 MED ORDER — LACTATED RINGERS IV SOLN: INTRAVENOUS | Status: DC | PRN

## 2023-09-19 MED ORDER — SODIUM CHLORIDE 0.9 % IV SOLN
Freq: Once | INTRAVENOUS | Status: DC
  Filled 2023-09-19: qty 10

## 2023-09-19 MED ORDER — LIDOCAINE-EPINEPHRINE 1 %-1:100000 IJ SOLN
INTRAMUSCULAR | Status: DC | PRN
  Administered 2023-09-19: 50 mL

## 2023-09-19 MED ORDER — LIDOCAINE-EPINEPHRINE 1 %-1:100000 IJ SOLN
INTRAMUSCULAR | Status: AC
  Filled 2023-09-19: qty 1

## 2023-09-19 MED ORDER — SUCCINYLCHOLINE CHLORIDE 200 MG/10ML IV SOSY
PREFILLED_SYRINGE | INTRAVENOUS | Status: DC | PRN
  Administered 2023-09-19: 200 mg via INTRAVENOUS

## 2023-09-19 MED ORDER — OXYCODONE HCL 5 MG/5ML PO SOLN: 5.0000 mg | Freq: Once | ORAL | Status: AC | PRN

## 2023-09-19 MED ORDER — OXYCODONE HCL 5 MG PO TABS
5.0000 mg | ORAL_TABLET | Freq: Once | ORAL | Status: AC | PRN
  Administered 2023-09-19: 5 mg via ORAL

## 2023-09-19 MED ORDER — BUPIVACAINE LIPOSOME 1.3 % IJ SUSP
INTRAMUSCULAR | Status: AC
  Filled 2023-09-19: qty 20

## 2023-09-19 MED ORDER — LACTATED RINGERS IV SOLN: INTRAVENOUS | Status: DC

## 2023-09-19 MED ORDER — PROPOFOL 10 MG/ML IV BOLUS
INTRAVENOUS | Status: AC
  Filled 2023-09-19: qty 20

## 2023-09-19 MED ORDER — ALBUMIN HUMAN 5 % IV SOLN: INTRAVENOUS | Status: DC | PRN

## 2023-09-19 MED ORDER — ONDANSETRON HCL 4 MG/2ML IJ SOLN
INTRAMUSCULAR | Status: DC | PRN
  Administered 2023-09-19: 4 mg via INTRAVENOUS

## 2023-09-19 MED ORDER — DEXAMETHASONE SODIUM PHOSPHATE 10 MG/ML IJ SOLN
INTRAMUSCULAR | Status: DC | PRN
  Administered 2023-09-19: 10 mg via INTRAVENOUS

## 2023-09-19 MED ORDER — DEXMEDETOMIDINE HCL IN NACL 80 MCG/20ML IV SOLN
INTRAVENOUS | Status: DC | PRN
  Administered 2023-09-19 (×5): 4 ug via INTRAVENOUS

## 2023-09-19 MED ORDER — MIDAZOLAM HCL 2 MG/2ML IJ SOLN
INTRAMUSCULAR | Status: DC | PRN
Start: 2023-09-19 — End: 2023-09-19
  Administered 2023-09-19: 2 mg via INTRAVENOUS

## 2023-09-19 MED ORDER — ONDANSETRON HCL 4 MG/2ML IJ SOLN: 4.0000 mg | Freq: Once | INTRAMUSCULAR | Status: DC | PRN

## 2023-09-19 MED ORDER — ROCURONIUM BROMIDE 10 MG/ML (PF) SYRINGE
PREFILLED_SYRINGE | INTRAVENOUS | Status: DC | PRN
  Administered 2023-09-19: 80 mg via INTRAVENOUS

## 2023-09-19 MED ORDER — DEXTROSE 5 % IV SOLN
INTRAVENOUS | Status: DC | PRN
  Administered 2023-09-19: 3 g via INTRAVENOUS

## 2023-09-19 MED ORDER — ACETAMINOPHEN 10 MG/ML IV SOLN
INTRAVENOUS | Status: DC | PRN
  Administered 2023-09-19: 1000 mg via INTRAVENOUS

## 2023-09-19 MED ORDER — BUPIVACAINE HCL (PF) 0.25 % IJ SOLN
INTRAMUSCULAR | Status: AC
  Filled 2023-09-19: qty 30

## 2023-09-19 MED ORDER — ORAL CARE MOUTH RINSE: 15.0000 mL | Freq: Once | OROMUCOSAL | Status: AC

## 2023-09-19 MED ORDER — SUGAMMADEX SODIUM 200 MG/2ML IV SOLN
INTRAVENOUS | Status: AC
  Filled 2023-09-19: qty 4

## 2023-09-19 MED ORDER — FENTANYL CITRATE (PF) 250 MCG/5ML IJ SOLN
INTRAMUSCULAR | Status: DC | PRN
  Administered 2023-09-19 (×2): 100 ug via INTRAVENOUS
  Administered 2023-09-19: 50 ug via INTRAVENOUS

## 2023-09-19 MED ORDER — LACTATED RINGERS IV SOLN
Freq: Once | INTRAVENOUS | Status: DC
  Filled 2023-09-19 (×3): qty 30

## 2023-09-19 MED ORDER — FENTANYL CITRATE (PF) 100 MCG/2ML IJ SOLN: 25.0000 ug | INTRAMUSCULAR | Status: DC | PRN

## 2023-09-19 MED ORDER — CHLORHEXIDINE GLUCONATE CLOTH 2 % EX PADS: 6.0000 | MEDICATED_PAD | Freq: Once | CUTANEOUS | Status: DC

## 2023-09-19 MED ORDER — GENTAMICIN SULFATE 40 MG/ML IJ SOLN
INTRAMUSCULAR | Status: AC
  Filled 2023-09-19: qty 2

## 2023-09-19 MED ORDER — SUGAMMADEX SODIUM 200 MG/2ML IV SOLN
INTRAVENOUS | Status: DC | PRN
  Administered 2023-09-19: 400 mg via INTRAVENOUS

## 2023-09-19 MED ORDER — LIDOCAINE 2% (20 MG/ML) 5 ML SYRINGE
INTRAMUSCULAR | Status: DC | PRN
  Administered 2023-09-19: 100 mg via INTRAVENOUS

## 2023-09-19 MED ORDER — ACETAMINOPHEN 10 MG/ML IV SOLN
INTRAVENOUS | Status: AC
  Filled 2023-09-19: qty 100

## 2023-09-19 MED ORDER — KETAMINE HCL 50 MG/5ML IJ SOSY
PREFILLED_SYRINGE | INTRAMUSCULAR | Status: AC
  Filled 2023-09-19: qty 5

## 2023-09-19 MED ORDER — KETAMINE HCL 10 MG/ML IJ SOLN
INTRAMUSCULAR | Status: DC | PRN
Start: 2023-09-19 — End: 2023-09-19
  Administered 2023-09-19: 50 mg via INTRAVENOUS

## 2023-09-19 MED ORDER — MIDAZOLAM HCL 2 MG/2ML IJ SOLN
INTRAMUSCULAR | Status: AC
  Filled 2023-09-19: qty 2

## 2023-09-19 MED ORDER — HYDROMORPHONE HCL 1 MG/ML IJ SOLN
INTRAMUSCULAR | Status: DC | PRN
  Administered 2023-09-19: .5 mg via INTRAVENOUS

## 2023-09-19 MED ORDER — HYDROMORPHONE HCL 1 MG/ML IJ SOLN
INTRAMUSCULAR | Status: AC
  Filled 2023-09-19: qty 0.5

## 2023-09-19 MED ORDER — 0.9 % SODIUM CHLORIDE (POUR BTL) OPTIME
TOPICAL | Status: DC | PRN
  Administered 2023-09-19: 1000 mL

## 2023-09-19 MED ORDER — FENTANYL CITRATE (PF) 250 MCG/5ML IJ SOLN
INTRAMUSCULAR | Status: AC
  Filled 2023-09-19: qty 5

## 2023-09-19 MED ORDER — ACETAMINOPHEN 10 MG/ML IV SOLN: 1000.0000 mg | Freq: Once | INTRAVENOUS | Status: DC | PRN

## 2023-09-19 SURGICAL SUPPLY — 72 items
BAG COUNTER SPONGE SURGICOUNT (BAG) ×1 IMPLANT
BAG DECANTER FOR FLEXI CONT (MISCELLANEOUS) IMPLANT
BINDER BREAST LRG (GAUZE/BANDAGES/DRESSINGS) IMPLANT
BINDER BREAST MEDIUM (GAUZE/BANDAGES/DRESSINGS) IMPLANT
BINDER BREAST XLRG (GAUZE/BANDAGES/DRESSINGS) IMPLANT
BINDER BREAST XXLRG (GAUZE/BANDAGES/DRESSINGS) IMPLANT
BIOPATCH RED 1 DISK 7.0 (GAUZE/BANDAGES/DRESSINGS) IMPLANT
BLADE SURG 10 STRL SS (BLADE) ×1 IMPLANT
BNDG GAUZE DERMACEA FLUFF 4 (GAUZE/BANDAGES/DRESSINGS) IMPLANT
CANISTER SUCT 3000ML PPV (MISCELLANEOUS) ×1 IMPLANT
CHLORAPREP W/TINT 26 (MISCELLANEOUS) ×1 IMPLANT
CLEANSER WND VASHE 34 (WOUND CARE) ×1 IMPLANT
CLEANSER WND VASHE INSTL 34OZ (WOUND CARE) IMPLANT
COTTONBALL LRG STERILE PKG (GAUZE/BANDAGES/DRESSINGS) IMPLANT
COUNTER NDL 20CT MAGNET RED (NEEDLE) IMPLANT
DERMABOND ADVANCED .7 DNX12 (GAUZE/BANDAGES/DRESSINGS) ×3 IMPLANT
DRAIN CHANNEL 19F RND (DRAIN) IMPLANT
DRAPE CHEST BREAST 15X10 FENES (DRAPES) ×1 IMPLANT
DRAPE HALF SHEET 40X57 (DRAPES) ×2 IMPLANT
DRAPE SURG ORHT 6 SPLT 77X108 (DRAPES) ×2 IMPLANT
DRESSING MEPILEX FLEX 4X4 (GAUZE/BANDAGES/DRESSINGS) IMPLANT
DRSG MEPILEX FLEX 4X4 (GAUZE/BANDAGES/DRESSINGS) ×2 IMPLANT
DRSG MEPILEX POST OP 4X8 (GAUZE/BANDAGES/DRESSINGS) ×2 IMPLANT
DRSG TEGADERM 4X4.75 (GAUZE/BANDAGES/DRESSINGS) IMPLANT
DRSG WOUND ADHESIVE SYLKE 010 (GAUZE/BANDAGES/DRESSINGS) IMPLANT
ELECT BLADE 4.0 EZ CLEAN MEGAD (MISCELLANEOUS) ×3 IMPLANT
ELECT CAUTERY BLADE 6.4 (BLADE) ×1 IMPLANT
ELECT REM PT RETURN 9FT ADLT (ELECTROSURGICAL) ×1 IMPLANT
ELECTRODE BLDE 4.0 EZ CLN MEGD (MISCELLANEOUS) ×2 IMPLANT
ELECTRODE REM PT RTRN 9FT ADLT (ELECTROSURGICAL) ×1 IMPLANT
EVACUATOR SILICONE 100CC (DRAIN) IMPLANT
GAUZE PAD ABD 8X10 STRL (GAUZE/BANDAGES/DRESSINGS) ×2 IMPLANT
GAUZE XEROFORM 5X9 LF (GAUZE/BANDAGES/DRESSINGS) IMPLANT
GLOVE BIO SURGEON STRL SZ 6.5 (GLOVE) ×2 IMPLANT
GLOVE BIO SURGEON STRL SZ7.5 (GLOVE) IMPLANT
GLOVE SURG ENC TEXT LTX SZ7.5 (GLOVE) IMPLANT
GOWN STRL REUS W/ TWL LRG LVL3 (GOWN DISPOSABLE) ×2 IMPLANT
HEMOSTAT ARISTA ABSORB 3G PWDR (HEMOSTASIS) IMPLANT
HEMOSTAT HEMOBLAST BELLOWS (HEMOSTASIS) IMPLANT
MARKER SKIN DUAL TIP RULER LAB (MISCELLANEOUS) ×1 IMPLANT
NDL HYPO 25GX1X1/2 BEV (NEEDLE) ×1 IMPLANT
NEEDLE HYPO 25GX1X1/2 BEV (NEEDLE) ×2 IMPLANT
NS IRRIG 1000ML POUR BTL (IV SOLUTION) ×1 IMPLANT
PACK GENERAL/GYN (CUSTOM PROCEDURE TRAY) ×1 IMPLANT
PAD FOAM SILICONE BACKED (GAUZE/BANDAGES/DRESSINGS) IMPLANT
POWDER MYRIAD MORCELLS 1000MG (Miscellaneous) IMPLANT
SLEEVE SCD COMPRESS KNEE XLRG (STOCKING) IMPLANT
SPIKE FLUID TRANSFER (MISCELLANEOUS) IMPLANT
SPONGE T-LAP 18X18 ~~LOC~~+RFID (SPONGE) ×2 IMPLANT
STAPLER SKIN PROX 35W (STAPLE) IMPLANT
STAPLER VISISTAT 35W (STAPLE) IMPLANT
STRIP CLOSURE SKIN 1/2X4 (GAUZE/BANDAGES/DRESSINGS) ×2 IMPLANT
SUT MNCRL AB 3-0 PS2 27 (SUTURE) IMPLANT
SUT MON AB 3-0 SH27 (SUTURE) IMPLANT
SUT MON AB 4-0 PS1 27 (SUTURE) IMPLANT
SUT MON AB 5-0 PS2 18 (SUTURE) IMPLANT
SUT PDS AB 2-0 CT2 27 (SUTURE) IMPLANT
SUT PDS AB 3-0 SH 27 (SUTURE) IMPLANT
SUT PROLENE 5 0 CC1 (SUTURE) IMPLANT
SUT PROLENE 6 0 C 1 30 (SUTURE) IMPLANT
SUT SILK 3 0 PS 1 (SUTURE) IMPLANT
SUT SILK 3 0 SH 30 (SUTURE) IMPLANT
SUT SILK 4 0 PS 2 (SUTURE) IMPLANT
SUT VIC AB 3-0 SH 27X BRD (SUTURE) IMPLANT
SUT VIC AB 4-0 PS2 18 (SUTURE) IMPLANT
SUT VIC AB 4-0 PS2 27 (SUTURE) IMPLANT
SUT VICRYL 4-0 PS2 18IN ABS (SUTURE) IMPLANT
SYR 50ML LL SCALE MARK (SYRINGE) IMPLANT
SYR BULB IRRIG 60ML STRL (SYRINGE) IMPLANT
SYR CONTROL 10ML LL (SYRINGE) ×1 IMPLANT
TOWEL GREEN STERILE FF (TOWEL DISPOSABLE) ×2 IMPLANT
TUBING INFILTRATION IT-10001 (TUBING) ×1 IMPLANT

## 2023-09-19 NOTE — Op Note (Signed)
 Breast Reduction Op note:    DATE OF PROCEDURE: 09/19/2023  LOCATION: [facility] Main Operating Room Outpatinet  SURGEON: Foster Simpson, DO  ASSISTANT: Keenan Bachelor, PA  PREOPERATIVE DIAGNOSIS 1. Macromastia 2. Neck Pain 3. Back Pain  POSTOPERATIVE DIAGNOSIS 1. Macromastia 2. Neck Pain 3. Back Pain  PROCEDURES 1. Bilateral breast reduction.  Right reduction 3200 g, Left reduction 2600 g  COMPLICATIONS: None.  DRAINS: bilateral  INDICATIONS FOR PROCEDURE Kim Hudson is a 33 y.o. year-old female born on Jan 16, 1991,with a history of symptomatic macromastia with concomitant back pain, neck pain, shoulder grooving from her bra.   MRN: [id number]  CONSENT Informed consent was obtained directly from the patient. The risks, benefits and alternatives were fully discussed. Specific risks including but not limited to bleeding, infection, hematoma, seroma, scarring, pain, nipple necrosis, asymmetry, poor cosmetic results, and need for further surgery were discussed. The patient's questions were answered.  DESCRIPTION OF PROCEDURE  Patient was brought into the operating room and rested on the operating room table in the supine position.  SCDs were placed and appropriate padding was performed.  Antibiotics were given. The patient underwent general anesthesia and the chest was prepped and draped in a sterile fashion.  A timeout was performed and all information was confirmed to be correct by those in the room. Tumescent was placed in each breast at the lateral aspect.  Liposuction was done laterally to help with the contour.   Right side: Preoperative markings were confirmed.  Incision lines were injected with local containing epinephrine.  After waiting for vasoconstriction, the marked lines were incised with a #15 blade.  A Wise-pattern amputation breast reduction was performed by de-epithelializing the central breast.  The breast tissue from the lateral and inferior portion of the  breast was removed with a combination of scissors and the bovie.  The nipple was removed as a full thickness graft and placed in the new raised location.  It was sutured in place with the 4-0 Vicryl and then a running subcuticular 4-0 Monocryl was used to close the skin edges. Experel and myriad were placed in the pocket.  A drain was placed and secured with the 3-0 Silk. The deep tissues were approximated with 3-0 PDS sutures.  The skin was closed with deep dermal 3-0 Monocryl.    Left side: Preoperative markings were confirmed.  Incision lines were injected with local containing epinephrine.  After waiting for vasoconstriction, the marked lines were incised with a #15 blade.  A Wise-pattern amputation breast reduction was performed by de-epithelializing the central breast.  The breast tissue from the lateral and inferior portion of the breast was removed with a combination of scissors and the bovie.  The nipple was removed as a full thickness graft and placed in the new raised location.  It was sutured in place with the 4-0 Vicryl and then a running subcuticular 4-0 Monocryl was used to close the skin edges. Experel and myriad were placed in the pocket.  Hemostat was placed in the pocket. A drain was placed and secured with the 3-0 Silk. The deep tissues were approximated with 3-0 PDS sutures.  The skin was closed with deep dermal 3-0 Monocryl.  Sylk was applied to both breasts. A breast binder and ABDs were placed.  The nipple and skin flaps had good capillary refill at the end of the procedure.  The patient tolerated the procedure well. The patient was allowed to wake from anesthesia and taken to the recovery room in satisfactory condition.  The advanced practice practitioner (APP) assisted throughout the case.  The APP was essential in retraction and counter traction when needed to make the case progress smoothly.  This retraction and assistance made it possible to see the tissue plans for the procedure.   The assistance was needed for blood control, tissue re-approximation and assisted with closure of the incision site.

## 2023-09-19 NOTE — Anesthesia Preprocedure Evaluation (Signed)
 Anesthesia Evaluation  Patient identified by MRN, date of birth, ID band Patient awake    Reviewed: Allergy & Precautions, NPO status , Patient's Chart, lab work & pertinent test results, reviewed documented beta blocker date and time   History of Anesthesia Complications Negative for: history of anesthetic complications  Airway Mallampati: II  TM Distance: >3 FB     Dental no notable dental hx.    Pulmonary neg COPD, Current Smoker   breath sounds clear to auscultation       Cardiovascular (-) angina (-) CAD, (-) Past MI and (-) Cardiac Stents  Rhythm:Regular Rate:Normal     Neuro/Psych neg Seizures    GI/Hepatic ,GERD  ,,(+) neg Cirrhosis        Endo/Other    Class 4 obesity  Renal/GU Renal disease     Musculoskeletal   Abdominal   Peds  Hematology   Anesthesia Other Findings   Reproductive/Obstetrics                             Anesthesia Physical Anesthesia Plan  ASA: 3  Anesthesia Plan: General   Post-op Pain Management:    Induction:   PONV Risk Score and Plan: 2 and Ondansetron and Dexamethasone  Airway Management Planned: Oral ETT and Video Laryngoscope Planned  Additional Equipment:   Intra-op Plan:   Post-operative Plan: Extubation in OR  Informed Consent: I have reviewed the patients History and Physical, chart, labs and discussed the procedure including the risks, benefits and alternatives for the proposed anesthesia with the patient or authorized representative who has indicated his/her understanding and acceptance.     Dental advisory given  Plan Discussed with: CRNA  Anesthesia Plan Comments:        Anesthesia Quick Evaluation

## 2023-09-19 NOTE — Transfer of Care (Signed)
 Immediate Anesthesia Transfer of Care Note  Patient: Kim Hudson  Procedure(s) Performed: BREAST REDUCTION WITH LIPOSUCTION (Bilateral: Breast)  Patient Location: PACU  Anesthesia Type:General  Level of Consciousness: sedated  Airway & Oxygen Therapy: Patient Spontanous Breathing and Patient connected to face mask oxygen  Post-op Assessment: Report given to RN and Post -op Vital signs reviewed and stable  Post vital signs: Reviewed and stable  Last Vitals:  Vitals Value Taken Time  BP 131/66 09/19/23 1748  Temp 36.8 C 09/19/23 1748  Pulse 105 09/19/23 1754  Resp 27 09/19/23 1754  SpO2 98 % 09/19/23 1754  Vitals shown include unfiled device data.  Last Pain:  Vitals:   09/19/23 0900  TempSrc:   PainSc: 0-No pain         Complications: No notable events documented.

## 2023-09-19 NOTE — Interval H&P Note (Signed)
 History and Physical Interval Note:  09/19/2023 8:51 AM  Kim Hudson  has presented today for surgery, with the diagnosis of macromastia.  The various methods of treatment have been discussed with the patient and family. After consideration of risks, benefits and other options for treatment, the patient has consented to  Procedure(s): BREAST REDUCTION WITH LIPOSUCTION (Bilateral) as a surgical intervention.  The patient's history has been reviewed, patient examined, no change in status, stable for surgery.  I have reviewed the patient's chart and labs.  Questions were answered to the patient's satisfaction.     Alena Bills Elliot Meldrum

## 2023-09-19 NOTE — OR Nursing (Signed)
 RIGHT BREAST WEIGHT 3,275GRAMS

## 2023-09-19 NOTE — Discharge Instructions (Signed)

## 2023-09-19 NOTE — OR Nursing (Signed)
 Left breast weight 2581 grams

## 2023-09-20 ENCOUNTER — Other Ambulatory Visit: Payer: Self-pay

## 2023-09-20 ENCOUNTER — Encounter (HOSPITAL_COMMUNITY): Payer: Self-pay | Admitting: Plastic Surgery

## 2023-09-20 NOTE — Anesthesia Postprocedure Evaluation (Signed)
 Anesthesia Post Note  Patient: Kim Hudson  Procedure(s) Performed: BREAST REDUCTION WITH LIPOSUCTION (Bilateral: Breast)     Patient location during evaluation: PACU Anesthesia Type: General Level of consciousness: awake Pain management: pain level controlled Vital Signs Assessment: post-procedure vital signs reviewed and stable Respiratory status: spontaneous breathing, nonlabored ventilation and respiratory function stable Cardiovascular status: blood pressure returned to baseline and stable Postop Assessment: no apparent nausea or vomiting Anesthetic complications: no   No notable events documented.  Last Vitals:  Vitals:   09/19/23 1815 09/19/23 1830  BP: 138/69 137/70  Pulse: 94 91  Resp: 19 18  Temp:  36.8 C  SpO2: 94% 92%    Last Pain:  Vitals:   09/19/23 1830  TempSrc:   PainSc: 3    Pain Goal:                   Kim Hudson

## 2023-09-21 ENCOUNTER — Telehealth: Payer: Self-pay | Admitting: Surgical

## 2023-09-21 LAB — SURGICAL PATHOLOGY

## 2023-09-21 NOTE — Telephone Encounter (Signed)
 Patient called wanting to know when she can take her bandages off and shower.  Please f/u

## 2023-09-22 NOTE — Telephone Encounter (Signed)
 Patient cannot shower until after her follow up appt with Dr. Algis Downs on 3/18. She had free nipple graft reduction. She will need to discuss when she can shower with provider at her appt. She can "wash up" with washcloths.

## 2023-09-27 ENCOUNTER — Ambulatory Visit: Payer: Medicaid Other | Admitting: Plastic Surgery

## 2023-09-27 VITALS — BP 121/63 | HR 91

## 2023-09-27 DIAGNOSIS — N62 Hypertrophy of breast: Secondary | ICD-10-CM

## 2023-09-27 NOTE — Progress Notes (Signed)
 The patient is a 33 year old female here for follow-up with her mom.  She underwent a breast reduction last week.  She had an amputation technique.  She had over 2000 g removed from both breast.  The drains were removed as well as the bolsters.  The patient needs to do Xeroform dressings to the areola at least every other day.  Do not get the nipples wet yet.  We will plan to see her back in 1 week.  Continue with the sports bra or the breast binder.  Pictures at next exam.

## 2023-09-30 ENCOUNTER — Telehealth: Payer: Self-pay | Admitting: Surgical

## 2023-09-30 NOTE — Telephone Encounter (Signed)
 Patient called and is concerned about her Left breast having a strange smell.  She would like a call back to speak with someone to make sure everything is ok.  She is concerned it may be getting infected.  She can be reached at [phone].

## 2023-09-30 NOTE — Telephone Encounter (Signed)
 Spoke with patient.

## 2023-10-04 ENCOUNTER — Telehealth: Payer: Self-pay | Admitting: Student

## 2023-10-04 NOTE — Telephone Encounter (Signed)
 Patient called the on-call service with concerns about her breast status post breast reduction with amputation technique.  She states that she has had some itchiness and rash to her breasts which she feels the warmer weather may be causing some of the irritation.  She denies any tongue swelling or difficulty breathing.  She denies any significant pain or drainage.  She denies any fevers or chills.  Patient also reports that she has some peeling from her NAC grafts.  Discussed with patient that she can take Benadryl at night and take an antihistamine such as Allegra or Claritin during the day to help with some of the itchiness.  Also recommended she keep the area clean and dry.  Also discussed with her that she can apply a hydrocortisone cream to her skin, but discussed with her she cannot apply this to her incisions.  Patient expressed understanding.  Discussed with patient that it is normal to have some peeling and to note some exudate with free nipple graft.  Patient reports she has been applying Vaseline to her NAC's daily.  Recommended she continue to do so.  Patient expressed understanding.  I did offer the patient an appointment tomorrow or later this week if she would like to be evaluated.  She declined at this time and states that she can wait until her appointment next week.  I discussed with her that if she has any worsening itchiness, rashes, redness, fevers, chills, pain, drainage, she should let us know.  Patient expressed understanding.  I instructed the patient to call back if she has any questions or concerns about anything.

## 2023-10-07 ENCOUNTER — Encounter (INDEPENDENT_AMBULATORY_CARE_PROVIDER_SITE_OTHER): Payer: Self-pay | Admitting: Physician Assistant

## 2023-10-07 DIAGNOSIS — Z9889 Other specified postprocedural states: Secondary | ICD-10-CM

## 2023-10-07 NOTE — Progress Notes (Signed)
 Patient is a 33 yo female s/p breast reduction surgery with free nipple graft performed earlier this month who called the on-call service.  She states that she sees something red coming out of her grafted nipple. She said it is not blood, but wants to know if she should be worried. Patient describes it as being a small area where a suture may have been. She's otherwise OK and states that she put Vaseline on the area.  Suspect that she is seeing some granulation from small incisional wound. She is in no distress on the phone call. 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  She can continue to focus on keeping the areas clean and covered and call the office should she develop any new or worsening symptoms. Otherwise recommend that she plan to follow up in-office on Monday.

## 2023-10-13 NOTE — Progress Notes (Unsigned)
 33 year old female here for follow-up after breast reduction via free nipple graft on 09/19/2023 with Dr. Ulice Bold.  She is 3-1/2 weeks postop.  Patient reports she is overall doing well, she has been doing Vaseline and gauze dressing changes to bilateral NAC grafts.  She is not having any infectious symptoms.  She does report that she is having difficulty with sleeping because she normally sleeps on her stomach but has been sleeping on her back.  She has been continuing with compressive garments without issue.  She has some questions about a few areas around the left NAC graft.   Chaperone present on exam BP 113/73 (BP Location: Left Arm, Patient Position: Sitting, Cuff Size: Large)   Pulse 79   Ht 5\' 7"  (1.702 m)   LMP 08/21/2023 (Exact Date)   SpO2 97%   BMI 50.59 kg/m   Patient is well-developed, well-nourished, no acute distress.  Breathing is unlabored.  On exam bilateral NAC grafts appear to be healing well, significant epithelialization is noted.  Pigmentation is beginning to return.  There is no subcutaneous fluid collections or cellulitic changes noted on exam.  She does have some postsurgical swelling.  There are some redness of the medial left breast, but no cellulitic changes.  There is no tenderness noted in this area.   A/P:  Patient is doing well, no signs infection or concern on exam.  She does have some redness of the medial left breast, suspect this is related to compression from her bra.  She reported she had some dressings that were increasing pressure to the area of her breast.  She is not having any symptoms concerning for infection so we will continue to monitor this area closely.

## 2023-10-14 ENCOUNTER — Encounter: Payer: Self-pay | Admitting: Surgical

## 2023-10-14 ENCOUNTER — Ambulatory Visit: Payer: Medicaid Other | Admitting: Surgical

## 2023-10-14 VITALS — BP 113/73 | HR 79 | Ht 67.0 in

## 2023-10-14 DIAGNOSIS — Z9889 Other specified postprocedural states: Secondary | ICD-10-CM

## 2023-10-28 ENCOUNTER — Encounter: Payer: Self-pay | Admitting: Surgical

## 2023-10-28 ENCOUNTER — Ambulatory Visit: Payer: Medicaid Other | Admitting: Surgical

## 2023-10-28 VITALS — BP 113/74 | HR 104 | Temp 98.5°F

## 2023-10-28 DIAGNOSIS — G8929 Other chronic pain: Secondary | ICD-10-CM

## 2023-10-28 DIAGNOSIS — Z9889 Other specified postprocedural states: Secondary | ICD-10-CM

## 2023-10-28 DIAGNOSIS — M546 Pain in thoracic spine: Secondary | ICD-10-CM

## 2023-10-28 DIAGNOSIS — N62 Hypertrophy of breast: Secondary | ICD-10-CM

## 2023-10-28 DIAGNOSIS — M542 Cervicalgia: Secondary | ICD-10-CM

## 2023-10-28 NOTE — Progress Notes (Signed)
 33 year old female here for follow-up after breast reduction via free nipple graft on 09/19/2023 with Dr. Orin Birk.  She is just shy of 6 weeks postop.  She reports she is overall doing well in regards to recovery, she is continuing with compressive garments, reports the binder has become uncomfortable because it feels too tight.  She feels as if it is putting a lot of pressure on her left breast.  She has been continuing with Vaseline and gauze to bilateral nipple areolar complexes and has seen some improvement.  She does still have wounds present of bilateral NAC's from the grafts healing.  Chaperone present on exam On exam bilateral NAC's about 50% epithelialized with the central portion with healthy granulation tissue still present.  She is beginning to develop pigmentation throughout the healed epithelium.  Bilateral breast incisions are overall intact and appear to be healing well.  She does have a small pinpoint wound to the left T-junction inferiorly.  There is no erythema or cellulitic changes noted by the breast.  Left breast is slightly larger than the right, but does seem to have some more swelling than the right.  I do not appreciate any subcutaneous fluid collection with palpation.  No foul odors are noted, no active drainage from breast wounds noted.  A/P:  Discussed with patient she can transition to wearing a sports bra, discussed with her that she should purchase a sports bra that fits her previously as sports bras are based on chest width and not cup size.  We discussed possibly ordering multiple and seeing which feels more comfortable for her.  We discussed that she should feel slight compression, but it should not be uncomfortable.  We discussed that she can be more lenient at this point now with compression when not active to give herself some breaks between compressive garments.  I do not appreciate any signs of infection or concern on exam.  I would like her to continue with  Vaseline gauze to bilateral breast wounds.  All of her questions were answered to her content, she was encouraged to call with any further questions, I would like to see her back in 3 weeks for reevaluation.  We discussed that it could continue to take some additional time for the nipple areolar complexes to heal.  We will plan to take pictures at her next appointment.

## 2023-11-17 NOTE — Progress Notes (Signed)
 Patient is a 33 y.o.-year-old female status post free nipple graft bilateral breast reduction with Dr.  Orin Birk on 09/19/2023. Patient is 8 weeks postop.  We have been continuing to monitor bilateral NAC grafts for complete epithelialization.  She reports is overall doing well.  She has been applying Vaseline to bilateral NAC grafts and inframammary fold wounds.    She does have some questions about fullness of her lateral breast  Chaperone present on exam Bilateral breast incisions are intact. There is no erythema or cellulitic changes noted. No obvious subcutaneous fluid collections noted with palpation.  Bilateral NAC grafts have nearly completely healed, she does have a small area of granulation tissue still present on the right NAC superior medially.  She does have a small wound at the T-junction on the left side and right side, the right side has completely epithelialized with hypopigmented skin.  The left side has a small area of granulation tissue still present.   A/P:  There is no signs of infection or concern on exam, discussed with patient to continue to apply Vaseline to bilateral breast wounds.  Discussed with patient she can transition to a sports bra if she would like, she can increase activity as tolerated.  Recommend following up in 3 weeks for reevaluation.  All of the patient's questions were answered to their content. Recommend calling with any questions or concerns.  Pictures were obtained of the patient and placed in the chart with the patient's or guardian's permission.

## 2023-11-18 ENCOUNTER — Ambulatory Visit (INDEPENDENT_AMBULATORY_CARE_PROVIDER_SITE_OTHER): Admitting: Surgical

## 2023-11-18 ENCOUNTER — Encounter: Payer: Self-pay | Admitting: Surgical

## 2023-11-18 VITALS — BP 153/85 | HR 88

## 2023-11-18 DIAGNOSIS — M546 Pain in thoracic spine: Secondary | ICD-10-CM

## 2023-11-18 DIAGNOSIS — Z9889 Other specified postprocedural states: Secondary | ICD-10-CM

## 2023-11-18 DIAGNOSIS — G8929 Other chronic pain: Secondary | ICD-10-CM

## 2023-11-18 DIAGNOSIS — N62 Hypertrophy of breast: Secondary | ICD-10-CM

## 2023-11-21 ENCOUNTER — Encounter (HOSPITAL_COMMUNITY): Payer: Self-pay | Admitting: Plastic Surgery

## 2023-11-22 ENCOUNTER — Encounter (HOSPITAL_COMMUNITY): Payer: Self-pay | Admitting: Plastic Surgery

## 2023-12-09 ENCOUNTER — Ambulatory Visit: Admitting: Surgical

## 2023-12-09 DIAGNOSIS — Z9889 Other specified postprocedural states: Secondary | ICD-10-CM

## 2023-12-09 DIAGNOSIS — G8929 Other chronic pain: Secondary | ICD-10-CM

## 2023-12-09 DIAGNOSIS — N62 Hypertrophy of breast: Secondary | ICD-10-CM

## 2023-12-09 DIAGNOSIS — M546 Pain in thoracic spine: Secondary | ICD-10-CM

## 2023-12-09 NOTE — Progress Notes (Signed)
 Patient is a 33 y.o.-year-old female status post bilateral breast reduction via free nipple graft with Dr.  Orin Birk.  Surgery was 09/19/2023.  She is just shy of 12 weeks postop.  She reports she is overall doing well, she has noticed some swelling of the right nipple graft occasionally.  She is not having any infectious symptoms.  Chaperone present on exam Bilateral nipple areolar grafts are well-healed.  Pigmentation is beginning to return around the periphery, centrally still has pink epithelium. There is no erythema or cellulitic changes noted. No obvious subcutaneous fluid collections noted with palpation.  All breast incisions are well-healed.   A/P:  Ms. Rote is doing really well, she is overall very happy with her results, reports that her neck and back feel much better and she has been able to improve her quality of life through being more active and having less pain with normal ADLs.  She is well-healed.  There is no signs of infection or concern on exam.  We discussed following up as needed.  We discussed transitioning to her normal bra whenever she feels comfortable, avoid underwire for at least another month.  All of the patient's questions were answered to their content. Recommend calling with any questions or concerns.

## 2023-12-16 ENCOUNTER — Ambulatory Visit: Payer: Self-pay | Admitting: Nurse Practitioner

## 2024-01-04 ENCOUNTER — Ambulatory Visit: Payer: Self-pay

## 2024-01-04 ENCOUNTER — Telehealth: Admitting: Physician Assistant

## 2024-01-04 DIAGNOSIS — M545 Low back pain, unspecified: Secondary | ICD-10-CM

## 2024-01-04 DIAGNOSIS — G8929 Other chronic pain: Secondary | ICD-10-CM | POA: Diagnosis not present

## 2024-01-04 MED ORDER — BACLOFEN 10 MG PO TABS: 10.0000 mg | ORAL_TABLET | Freq: Three times a day (TID) | ORAL | 0 refills | Status: DC

## 2024-01-04 NOTE — Telephone Encounter (Signed)
 Copied from CRM [id number]. Topic: Clinical - Red Word Triage >> Jan 04, 2024 12:25 PM Turkey B wrote: [facility] that prompted transfer to Nurse Triage: pt has severe back pain when sitting    FYI Only or Action Required?: FYI only for provider.  Patient was last seen in primary care on 06/17/2023 by Oley Bascom RAMAN, NP. Called Nurse Triage reporting Back Pain. Symptoms began chronic problem. Interventions attempted: OTC medications: Tylenol . Symptoms are: unchanged.  Triage Disposition: See HCP Within 4 Hours (Or PCP Triage)  Patient/caregiver understands and will follow disposition?: Yes    Reason for Disposition  [1] SEVERE back pain (e.g., excruciating, unable to do any normal activities) AND [2] not improved 2 hours after pain medicine  Answer Assessment - Initial Assessment Questions 1. ONSET: When did the pain begin?      Quite a while 2. LOCATION: Where does it hurt? (upper, mid or lower back)     Upper to middle back 3. SEVERITY: How bad is the pain?  (e.g., Scale 1-10; mild, moderate, or severe)   - MILD (1-3): Doesn't interfere with normal activities.    - MODERATE (4-7): Interferes with normal activities or awakens from sleep.    - SEVERE (8-10): Excruciating pain, unable to do any normal activities.      Moderate to severe  4. PATTERN: Is the pain constant? (e.g., yes, no; constant, intermittent)      Constant  5. RADIATION: Does the pain shoot into your legs or somewhere else?     No 7. BACK OVERUSE:  Any recent lifting of heavy objects, strenuous work or exercise?     No 8. MEDICINES: What have you taken so far for the pain? (e.g., nothing, acetaminophen , NSAIDS)     Tylenol   9. NEUROLOGIC SYMPTOMS: Do you have any weakness, numbness, or problems with bowel/bladder control?     No 10. OTHER SYMPTOMS: Do you have any other symptoms? (e.g., fever, abdomen pain, burning with urination, blood in urine)       No  Protocols used: Back Pain-A-AH

## 2024-01-04 NOTE — Patient Instructions (Addendum)
 Kim Hudson, thank you for joining Kim Velma Lunger, PA-C for today's virtual visit.  While this provider is not your primary care provider (PCP), if your PCP is located in our provider database this encounter information will be shared with them immediately following your visit.   A [facility] MyChart account gives you access to today's visit and all your visits, tests, and labs performed at [facility]  click here if you don't have a [facility] MyChart account or go to [email or web address]  Consent: (Patient) Kim Hudson provided verbal consent for this virtual visit at the beginning of the encounter.  Current Medications:  Current Outpatient Medications:    meloxicam  (MOBIC ) 7.5 MG tablet, TAKE 1 TABLET BY MOUTH EVERY DAY, Disp: 30 tablet, Rfl: 0   ondansetron  (ZOFRAN ) 4 MG tablet, Take 1 tablet (4 mg total) by mouth every 8 (eight) hours as needed for nausea or vomiting., Disp: 20 tablet, Rfl: 0   Vitamin D , Ergocalciferol , (DRISDOL ) 1.25 MG (50000 UNIT) CAPS capsule, Take 1 capsule (50,000 Units total) by mouth every 7 (seven) days., Disp: 5 capsule, Rfl: 0   Medications ordered in this encounter:  No orders of the defined types were placed in this encounter.    *If you need refills on other medications prior to your next appointment, please contact your pharmacy*  Follow-Up: Call back or seek an in-person evaluation if the symptoms worsen or if the condition fails to improve as anticipated.  [facility] [phone]  Other Instructions Please apply heating area to the bothersome areas for 10-15 minutes at a time when able. Continue light stretching. Ok to continue the Meloxicam  given by your PCP. Take the Baclofen as directed when needed for back pain/tension. Please see resources below to get scheduled with a specialist.  [facility]   Monday - Friday   11AM-7PM   Schedule via website (see  below) or as walk-in   [address], [address]   [place], [place] [address]   [phone]   [facility]  [place], [place]        [facility]   [address]  [place], [place] [address]   GET DIRECTIONS   CONTACT   Phone: [phone]   Phone: [phone]   URGENT CARE HOURS   Monday - Friday:   8:00am to 8:00pm   Saturday:   10:00am to 3:00pm         [facility]   [address]  [place], [place] [address]   GET DIRECTIONS   CONTACT   Phone: [phone]   URGENT CARE HOURS   Monday:   9:00AM - 9:00PM   Tuesday:   9:00AM - 9:00PM   Wednesday:   9:00AM - 9:00PM   Thursday:   9:00AM - 9:00PM   Friday:   9:00AM - 9:00PM   Saturday:   9:00AM - 9:00PM   Sunday:   9:00AM - 9:00PM   HOLIDAY HOURS   Holidays:   8:30AM - 4:30PM      Kim Hudson   After Hours [facility] [phone]      Orth[facility] [address] [place], [place] [address]      EVENINGS & WEEKENDS NO APPOINTMENT NECESSARY Mon-Fri 5:30PM - 9PM    Sat 9 AM - 2 PM Sun 10 AM - 2 PM      [facility]   [address]  [address]  [place], [place] [address]   [phone]      OFFICE HOURS:   MONDAY - FRIDAY : 8:00 A.M. - 4:00 P.M.      [facility]  [address]  [place], [place] [address]   [phone]      OFFICE HOURS:   MONDAY - FRIDAY: 8:00 A.M. - 8:30 P.M.   SATURDAY: 10:00 A.M. - 2:00 P.M.          If you have been instructed to have an in-person evaluation today at a local Urgent Care facility, please use the link below. It will take you to a list of all of our available [facility], including address, phone number and hours of operation. Please do not delay care.  [facility]  If you or a family member do not have a primary  care provider, use the link below to schedule a visit and establish care. When you choose a [facility] primary care physician or advanced practice provider, you gain a long-term partner in health. Find a Primary Care Provider  Learn more about [facility]'s in-office and virtual care options: [facility] - Get Care Now
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
## 2024-01-05 NOTE — Telephone Encounter (Signed)
 Pt was seen  yesterday will start medication today and advise if she needs us . [facility]

## 2024-01-23 ENCOUNTER — Other Ambulatory Visit (HOSPITAL_COMMUNITY): Payer: Self-pay

## 2024-01-23 ENCOUNTER — Encounter: Payer: Self-pay | Admitting: Nurse Practitioner

## 2024-01-23 ENCOUNTER — Other Ambulatory Visit: Payer: Self-pay

## 2024-01-23 ENCOUNTER — Ambulatory Visit (INDEPENDENT_AMBULATORY_CARE_PROVIDER_SITE_OTHER): Admitting: Nurse Practitioner

## 2024-01-23 DIAGNOSIS — G8929 Other chronic pain: Secondary | ICD-10-CM | POA: Diagnosis not present

## 2024-01-23 DIAGNOSIS — M545 Low back pain, unspecified: Secondary | ICD-10-CM | POA: Diagnosis not present

## 2024-01-23 MED ORDER — BACLOFEN 10 MG PO TABS
10.0000 mg | ORAL_TABLET | Freq: Three times a day (TID) | ORAL | 0 refills | Status: DC
  Filled 2024-01-23 (×2): qty 30, 10d supply, fill #0

## 2024-01-23 NOTE — Progress Notes (Signed)
 Subjective   Patient ID: Kim Hudson, female    DOB: 1991/02/17, 33 y.o.   MRN: [id number]  Chief Complaint  Patient presents with   Follow-up    Referring provider: [facility]*  Deicy Rusk is a 33 y.o. female with Past Medical History: No date: Back pain No date: GERD (gastroesophageal reflux disease) 03/03/2017: Maternal obesity syndrome, antepartum, third trimester No date: Obese 03/04/2017: SVD (spontaneous vaginal delivery)   HPI   Patient presents today for a follow-up.  She does continue to have chronic low back pain.  We will place referral for orthopedics and pain management.  We will refill baclofen .  Patient does need a Pap smear we will schedule her to come back for this because she is having her menstrual cycle today. Denies f/c/s, n/v/d, hemoptysis, PND, leg swelling Denies chest pain or edema    No Known Allergies   There is no immunization history on file for this patient.  Tobacco History: Social History   Tobacco Use  Smoking Status Some Days   Types: Cigars  Smokeless Tobacco Never   Ready to quit: Not Answered Counseling given: Not Answered   Outpatient Encounter Medications as of 01/23/2024  Medication Sig   ondansetron  (ZOFRAN ) 4 MG tablet Take 1 tablet (4 mg total) by mouth every 8 (eight) hours as needed for nausea or vomiting.   Vitamin D , Ergocalciferol , (DRISDOL ) 1.25 MG (50000 UNIT) CAPS capsule Take 1 capsule (50,000 Units total) by mouth every 7 (seven) days.   [DISCONTINUED] baclofen  (LIORESAL ) 10 MG tablet Take 1 tablet (10 mg total) by mouth 3 (three) times daily.   baclofen  (LIORESAL ) 10 MG tablet Take 1 tablet (10 mg total) by mouth 3 (three) times daily.   [DISCONTINUED] omeprazole  (PRILOSEC) 20 MG capsule Take 1 po BID x 2 weeks then once a day (Patient not taking: Reported on 06/25/2014)   No facility-administered encounter medications on file as of 01/23/2024.    Review of Systems  Review of Systems   Constitutional: Negative.   HENT: Negative.    Cardiovascular: Negative.   Gastrointestinal: Negative.   Allergic/Immunologic: Negative.   Neurological: Negative.   Psychiatric/Behavioral: Negative.       Objective:   BP (!) 144/70 (BP Location: Left Arm, Patient Position: Sitting, Cuff Size: Large)   Pulse 82   Ht 5' 7 (1.702 m)   Wt (!) 343 lb (155.6 kg)   LMP 01/21/2024 (Approximate)   SpO2 97%   BMI 53.72 kg/m   Wt Readings from Last 5 Encounters:  01/23/24 (!) 343 lb (155.6 kg)  09/19/23 (!) 323 lb (146.5 kg)  08/26/23 (!) 361 lb 12.8 oz (164.1 kg)  06/17/23 (!) 361 lb 12.8 oz (164.1 kg)  03/04/17 (!) 365 lb (165.6 kg)     Physical Exam Vitals and nursing note reviewed.  Constitutional:      General: She is not in acute distress.    Appearance: She is well-developed.  Cardiovascular:     Rate and Rhythm: Normal rate and regular rhythm.  Pulmonary:     Effort: Pulmonary effort is normal.     Breath sounds: Normal breath sounds.  Neurological:     Mental Status: She is alert and oriented to person, place, and time.       Assessment & Plan:   Chronic bilateral low back pain without sciatica -     Baclofen ; Take 1 tablet (10 mg total) by mouth 3 (three) times daily.  Dispense: 30 each; Refill:  0 -     Ambulatory referral to Orthopedic Surgery -     Ambulatory referral to Pain Clinic     Return in about 6 months (around 07/25/2024).   Bas[place] GORMAN Borer, NP 01/23/2024

## 2024-01-26 ENCOUNTER — Encounter: Payer: Self-pay | Admitting: Physical Medicine & Rehabilitation

## 2024-02-16 ENCOUNTER — Ambulatory Visit: Admitting: Physical Medicine and Rehabilitation

## 2024-02-16 ENCOUNTER — Other Ambulatory Visit: Payer: Self-pay

## 2024-02-16 ENCOUNTER — Encounter: Payer: Self-pay | Admitting: Physical Medicine and Rehabilitation

## 2024-02-16 ENCOUNTER — Other Ambulatory Visit (HOSPITAL_COMMUNITY): Payer: Self-pay

## 2024-02-16 ENCOUNTER — Other Ambulatory Visit: Payer: Self-pay | Admitting: Nurse Practitioner

## 2024-02-16 DIAGNOSIS — M7918 Myalgia, other site: Secondary | ICD-10-CM | POA: Diagnosis not present

## 2024-02-16 DIAGNOSIS — M47817 Spondylosis without myelopathy or radiculopathy, lumbosacral region: Secondary | ICD-10-CM | POA: Diagnosis not present

## 2024-02-16 DIAGNOSIS — G8929 Other chronic pain: Secondary | ICD-10-CM

## 2024-02-16 DIAGNOSIS — M545 Low back pain, unspecified: Secondary | ICD-10-CM

## 2024-02-16 MED ORDER — MELOXICAM 15 MG PO TABS
15.0000 mg | ORAL_TABLET | Freq: Every day | ORAL | 0 refills | Status: DC
  Filled 2024-02-16 (×2): qty 30, 30d supply, fill #0

## 2024-02-16 NOTE — Progress Notes (Signed)
 Pain Scale   Average Pain 5 Patient advised she has lower back pain that her pain radiating to both legs        +Driver, -BT, -Dye Allergies.

## 2024-02-16 NOTE — Progress Notes (Signed)
 Kim Hudson - 33 y.o. female MRN [id number]  Date of birth: 1990-08-14  Office Visit Note: Visit Date: 02/16/2024 PCP: [facility] Referred by: Oley Bascom RAMAN, NP  Subjective: Chief Complaint  Patient presents with   Lower Back - Pain   HPI: Kim Hudson is a 33 y.o. female who comes in today per the request of Bascom Oley, NP for evaluation of chronic, worsening and severe bilateral lower back pain. Pain ongoing for several years, worsens with movement and activity. Her pain becomes worse with household activities such as cooking and cleaning. She describes her pain as tight and sore sensation, currently rates as 8 out of 10. Some relief of pain with home exercise regimen, massage belt, ice/heat, rest and use of medications. Some relief of pain with baclofen . No history of formal physical therapy. She recently underwent breast reduction surgery in March. She was hopeful this surgery would help to alleviate her pain. 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 Patient denies focal weakness, numbness and tingling. No recent trauma or falls.      Review of Systems  Musculoskeletal:  Positive for back pain and myalgias.  Neurological:  Negative for tingling, sensory change, focal weakness and weakness.  All other systems reviewed and are negative.  Otherwise per HPI.  Assessment & Plan: Visit Diagnoses:    ICD-10-CM   1. Chronic bilateral low back pain without sciatica  M54.50 Ambulatory referral to Physical Therapy   G89.29     2. Spondylosis without myelopathy or radiculopathy, lumbosacral region  M47.817 Ambulatory referral to Physical Therapy    3. Myofascial pain syndrome  M79.18 Ambulatory referral to Physical Therapy       Plan: Findings:  Chronic, worsening and severe bilateral lower back pain. No radicular symptoms down the legs. Patient continues to have severe pain despite good conservative therapies such as home exercise regimen, rest and use of medications.  Patients clinical presentation and exam are consistent with non specific lower back pain. Recent lumbar radiographs were fairly normal for her age. No spondylolisthesis, no pars defects. We discussed treatment plan in detail today. Next step is to place order for short course of formal physical therapy. I do think she would benefit from core strengthening and manual treatments. She can continue with baclofen  as needed, I also prescribed meloxicam  for her to try. I would like to see her back in approximately 8 weeks for re-evaluation. Should her pain persist would consider obtaining lumbar MRI imaging. She has no questions at this time. No red flag symptoms noted upon exam today.     Meds & Orders:  Meds ordered this encounter  Medications   meloxicam  (MOBIC ) 15 MG tablet    Sig: Take 1 tablet (15 mg total) by mouth daily.    Dispense:  30 tablet    Refill:  0    Orders Placed This Encounter  Procedures   Ambulatory referral to Physical Therapy    Follow-up: Return for 8 week follow up post physical therapy.   Procedures: No procedures performed      Clinical History: No specialty comments available.   She reports that she has been smoking cigars. She has never used smokeless tobacco. No results for input(s): HGBA1C, LABURIC in the last 8760 hours.  Objective:  VS:  HT:    WT:   BMI:     BP:   HR: bpm  TEMP: ( )  RESP:  Physical Exam Vitals and nursing note reviewed.  HENT:  Head: Normocephalic and atraumatic.     Right Ear: External ear normal.     Left Ear: External ear normal.     Nose: Nose normal.     Mouth/Throat:     Mouth: Mucous membranes are moist.  Eyes:     Extraocular Movements: Extraocular movements intact.  Cardiovascular:     Rate and Rhythm: Normal rate.     Pulses: Normal pulses.  Pulmonary:     Effort: Pulmonary effort is normal.  Abdominal:     General: Abdomen is flat. There is no distension.  Musculoskeletal:        General: Tenderness  present.     Cervical back: Normal range of motion.     Comments: Patient rises from seated position to standing without difficulty. Good lumbar range of motion. No pain noted with facet loading. 5/5 strength noted with bilateral hip flexion, knee flexion/extension, ankle dorsiflexion/plantarflexion and EHL. No clonus noted bilaterally. No pain upon palpation of greater trochanters. No pain with internal/external rotation of bilateral hips. Sensation intact bilaterally. Myofascial tenderness noted to bilateral lumbar paraspinal regions upon palpation. Negative slump test bilaterally. Ambulates without aid, gait steady.     Skin:    General: Skin is warm and dry.     Capillary Refill: Capillary refill takes less than 2 seconds.  Neurological:     General: No focal deficit present.     Mental Status: She is alert and oriented to person, place, and time.  Psychiatric:        Mood and Affect: Mood normal.        Behavior: Behavior normal.     Ortho Exam  Imaging: No results found.  Past Medical/Family/Surgical/Social History: Medications & Allergies reviewed per EMR, new medications updated. Patient Active Problem List   Diagnosis Date Noted   Symptomatic mammary hypertrophy 08/26/2023   Back pain 08/26/2023   Neck pain 08/26/2023   Normal pregnancy in multigravida in third trimester 03/04/2017   SVD (spontaneous vaginal delivery) 03/04/2017   Maternal obesity syndrome, antepartum, third trimester 03/03/2017   Past Medical History:  Diagnosis Date   Back pain    GERD (gastroesophageal reflux disease)    Maternal obesity syndrome, antepartum, third trimester 03/03/2017   Obese    SVD (spontaneous vaginal delivery) 03/04/2017   Family History  Problem Relation Age of Onset   Hypertension Mother    Past Surgical History:  Procedure Laterality Date   BREAST REDUCTION SURGERY Bilateral 09/19/2023   Procedure: BREAST REDUCTION WITH LIPOSUCTION;  Surgeon: Lowery Estefana RAMAN, DO;   Location: [facility] OR;  Service: Plastics;  Laterality: Bilateral;   NO PAST SURGERIES     Social History   Occupational History   Not on file  Tobacco Use   Smoking status: Some Days    Types: Cigars   Smokeless tobacco: Never  Vaping Use   Vaping status: Never Used  Substance and Sexual Activity   Alcohol use: No   Drug use: No   Sexual activity: Yes    Birth control/protection: None

## 2024-02-16 NOTE — Telephone Encounter (Signed)
 Please advise [facility]

## 2024-02-17 ENCOUNTER — Other Ambulatory Visit: Payer: Self-pay

## 2024-02-17 MED ORDER — BACLOFEN 10 MG PO TABS
10.0000 mg | ORAL_TABLET | Freq: Three times a day (TID) | ORAL | 0 refills | Status: DC
  Filled 2024-02-17: qty 30, 10d supply, fill #0

## 2024-02-20 ENCOUNTER — Other Ambulatory Visit: Payer: Self-pay

## 2024-03-05 ENCOUNTER — Ambulatory Visit: Admitting: Physical Therapy

## 2024-03-05 NOTE — Therapy (Incomplete)
 OUTPATIENT PHYSICAL THERAPY THORACOLUMBAR EVALUATION  Patient Name: Kim Hudson MRN: [id number] DOB:[birth year], 33 y.o., female Today's Date: 03/05/2024    Past Medical History:  Diagnosis Date   Back pain    GERD (gastroesophageal reflux disease)    Maternal obesity syndrome, antepartum, third trimester 03/03/2017   Obese    SVD (spontaneous vaginal delivery) 03/04/2017   Past Surgical History:  Procedure Laterality Date   BREAST REDUCTION SURGERY Bilateral 09/19/2023   Procedure: BREAST REDUCTION WITH LIPOSUCTION;  Surgeon: Lowery Estefana RAMAN, DO;  Location: [facility] OR;  Service: Plastics;  Laterality: Bilateral;   NO PAST SURGERIES     Patient Active Problem List   Diagnosis Date Noted   Symptomatic mammary hypertrophy 08/26/2023   Back pain 08/26/2023   Neck pain 08/26/2023   Normal pregnancy in multigravida in third trimester 03/04/2017   SVD (spontaneous vaginal delivery) 03/04/2017   Maternal obesity syndrome, antepartum, third trimester 03/03/2017    PCP: [facility]  REFERRING PROVIDER: Trudy Duwaine BRAVO, NP  THERAPY DIAG:  No diagnosis found.  REFERRING DIAG: Chronic bilateral low back pain without sciatica [M54.50, G89.29], Spondylosis without myelopathy or radiculopathy, lumbosacral region [M47.817], Myofascial pain syndrome [M79.18]   Rationale for Evaluation and Treatment:  Rehabilitation  SUBJECTIVE:  PERTINENT PAST HISTORY:  Symptomatic mammary hypertrophy         PRECAUTIONS: None  WEIGHT BEARING RESTRICTIONS No  FALLS:  Has patient fallen in last 6 months? {yes/no:20286}, Number of falls: ***  MOI/History of condition:  Onset date: chronic  SUBJECTIVE STATEMENT  Pt is a 33 y.o. female who presents to clinic with chief complaint of chronic low back pain.  Pt underwent breast reduction in March which was minimally helpful concerning her low back pain.  ***   Red flags:  {has/denies:26543} {kerredflag:26542}  Pain:  Are  you having pain? {yes/no:20286} Pain location: *** NPRS scale:  Best: {NUMBERS; 0-10:5044}/10, Worst: {NUMBERS; 0-10:5044}/10 Aggravating factors: *** Relieving factors: *** Pain description: {PAIN DESCRIPTION:21022940}  Occupation: ***  Assistive Device: na  Hand Dominance: na  Patient Goals/Specific Activities: ***   OBJECTIVE:   DIAGNOSTIC FINDINGS:  X-ray:  IMPRESSION: No acute osseous abnormality.  GENERAL OBSERVATION/GAIT: ***  SENSATION: Light touch: {intact/deficits:24005}  LUMBAR AROM  AROM AROM  (Eval)  Flexion {kerromlxflex:28296}  Extension {kerromcxlx:26716}  Right lateral flexion {kerromcxlx:26716}  Left lateral flexion {kerromcxlx:26716}  Right rotation {kerromcxlx:26716}  Left rotation {kerromcxlx:26716}    (Blank rows = not tested)   LE MMT:  MMT Right (Eval) Left (Eval)  Hip flexion (L2, L3) *** ***  Knee extension (L3) *** ***  Knee flexion    Hip abduction *** ***  Hip extension *** ***  Hip external rotation *** ***  Hip internal rotation *** ***  Hip adduction    Ankle dorsiflexion (L4)    Ankle plantarflexion (S1)    Ankle inversion    Ankle eversion    Great Toe ext (L5)    Grossly     (Blank rows = not tested, score listed is out of 5 possible points.  N = WNL, D = diminished, C = clear for gross weakness with myotome testing, * = concordant pain with testing)  SPECIAL TESTS:  Straight leg raise: L (***), R (***) Slump: L (***), R (***)  MUSCLE LENGTH: Hamstrings: Right {kerminsig:27227} restriction; Left {kerminsig:27227} restriction Hip flexors: Right {kerminsig:27227} restriction; Left {kerminsig:27227} restriction  LE ROM:  ROM Right (Eval) Left (Eval)  Hip flexion    Hip extension  Hip abduction    Hip adduction    Hip internal rotation    Hip external rotation    Knee flexion    Knee extension    Ankle dorsiflexion    Ankle plantarflexion    Ankle inversion    Ankle eversion      (Blank rows =  not tested, N = WNL, * = concordant pain with testing)  Functional Tests  Eval    30'' STS: ***x  UE used? ***                                                            PALPATION:   ***  PATIENT SURVEYS:  ODI: ***  TODAY'S TREATMENT  Therapeutic Exercise: Creating, reviewing, and completing below HEP  PATIENT EDUCATION (Kim Hudson):  POC, diagnosis, prognosis, HEP, and outcome measures.  Pt educated via explanation, demonstration, and handout (HEP).  Pt confirms understanding verbally.   HOME EXERCISE PROGRAM: ***  Treatment priorities   Eval                                                  ASSESSMENT:  CLINICAL IMPRESSION: Kim Hudson is a 33 y.o. female who presents to clinic with signs and sxs consistent with chronic low back pain.   X-ay normal.  ***.   Kim Hudson will benefit from skilled PT to address relevant deficits and improve ***.   OBJECTIVE IMPAIRMENTS: Pain, lumbar ROM, LE and core strength  ACTIVITY LIMITATIONS: walking, standing, sitting, transfers  PERSONAL FACTORS: See medical history and pertinent history   REHAB POTENTIAL: Good  CLINICAL DECISION MAKING: Evolving/moderate complexity  EVALUATION COMPLEXITY: Moderate   GOALS:   SHORT TERM GOALS: Target date: 04/02/2024   Kim Hudson will be >75% HEP compliant to improve carryover between sessions and facilitate independent management of condition  Evaluation: ongoing Goal status: INITIAL   LONG TERM GOALS: Target date: 04/30/2024    Kim Hudson will self report >/= 50% decrease in pain from evaluation to improve function in daily tasks  Evaluation/Baseline: ***/10 max pain Goal status: INITIAL   2.  Kim Hudson will show a >/= *** pt improvement in their ODI score (MCID is 12% or 6/50 pts) as a proxy for functional improvement   Evaluation/Baseline: *** pts Goal status: INITIAL   3.  Kim Hudson will be able to ***, not limited by pain  Evaluation/Baseline: limited Goal status:  INITIAL   4.  ***   5.  ***   6.  ***   PLAN: PT FREQUENCY: 1-2x/week  PT DURATION: 8 weeks  PLANNED INTERVENTIONS:  97164- PT Re-evaluation, 97110-Therapeutic exercises, 97530- Therapeutic activity, [id number]- Neuromuscular re-education, 97535- Self Care, 02859- Manual therapy, [id number]- Gait training, [id number]- Aquatic Therapy, [phone]- Electrical stimulation (manual), [id number]- Vasopneumatic device, M403810- Traction (mechanical), [id number]- Ionotophoresis 4mg /ml Dexamethasone , Taping, Dry Needling, Joint manipulation, and Spinal manipulation.   Emalee Knies PT, DPT 03/05/2024, 7:50 AM  I just finished a MCD eval/recert.  Name: Kim Hudson  MRN: [id number] {kermcd1:25763}  Check all conditions that are expected to impact treatment: {Conditions expected to impact treatment:28273}   I {kermcd2:32642} put a charge in.  Check all possible CPT codes: 02889- Therapeutic Exercise, [phone]-  Neuro Re-education, [phone] - Gait Training, 02859 - Manual Therapy, [id number] - Therapeutic Activities, [id number] - Self Care, [phone] - Re-evaluation, M403810 - Mechanical traction, and [id number] - Aquatic therapy   Thank you!  MCD - Secure

## 2024-03-18 ENCOUNTER — Other Ambulatory Visit: Payer: Self-pay | Admitting: Nurse Practitioner

## 2024-03-18 ENCOUNTER — Other Ambulatory Visit: Payer: Self-pay | Admitting: Physical Medicine and Rehabilitation

## 2024-03-18 ENCOUNTER — Other Ambulatory Visit: Payer: Self-pay

## 2024-03-18 DIAGNOSIS — M545 Low back pain, unspecified: Secondary | ICD-10-CM

## 2024-03-19 ENCOUNTER — Other Ambulatory Visit (HOSPITAL_COMMUNITY): Payer: Self-pay

## 2024-03-19 ENCOUNTER — Other Ambulatory Visit: Payer: Self-pay | Admitting: Nurse Practitioner

## 2024-03-19 ENCOUNTER — Other Ambulatory Visit: Payer: Self-pay

## 2024-03-19 DIAGNOSIS — M545 Low back pain, unspecified: Secondary | ICD-10-CM

## 2024-03-19 MED ORDER — BACLOFEN 10 MG PO TABS
10.0000 mg | ORAL_TABLET | Freq: Three times a day (TID) | ORAL | 0 refills | Status: DC
  Filled 2024-03-19: qty 30, 10d supply, fill #0

## 2024-03-19 MED FILL — Meloxicam Tab 15 MG: ORAL | 30 days supply | Qty: 30 | Fill #0 | Status: CN

## 2024-03-20 ENCOUNTER — Other Ambulatory Visit: Payer: Self-pay

## 2024-03-20 ENCOUNTER — Other Ambulatory Visit (HOSPITAL_COMMUNITY): Payer: Self-pay

## 2024-03-20 MED FILL — Meloxicam Tab 15 MG: ORAL | 30 days supply | Qty: 30 | Fill #0 | Status: AC

## 2024-03-21 ENCOUNTER — Other Ambulatory Visit (HOSPITAL_COMMUNITY): Payer: Self-pay

## 2024-03-22 ENCOUNTER — Other Ambulatory Visit: Payer: Self-pay

## 2024-03-22 ENCOUNTER — Ambulatory Visit: Attending: Physical Medicine and Rehabilitation | Admitting: Physical Therapy

## 2024-03-22 ENCOUNTER — Encounter: Payer: Self-pay | Admitting: Physical Therapy

## 2024-03-22 DIAGNOSIS — M6281 Muscle weakness (generalized): Secondary | ICD-10-CM | POA: Insufficient documentation

## 2024-03-22 DIAGNOSIS — M545 Low back pain, unspecified: Secondary | ICD-10-CM | POA: Diagnosis not present

## 2024-03-22 DIAGNOSIS — M25562 Pain in left knee: Secondary | ICD-10-CM | POA: Insufficient documentation

## 2024-03-22 DIAGNOSIS — M5459 Other low back pain: Secondary | ICD-10-CM | POA: Insufficient documentation

## 2024-03-22 DIAGNOSIS — M25561 Pain in right knee: Secondary | ICD-10-CM | POA: Insufficient documentation

## 2024-03-22 DIAGNOSIS — G8929 Other chronic pain: Secondary | ICD-10-CM | POA: Diagnosis not present

## 2024-03-22 DIAGNOSIS — M7918 Myalgia, other site: Secondary | ICD-10-CM | POA: Insufficient documentation

## 2024-03-22 DIAGNOSIS — M47817 Spondylosis without myelopathy or radiculopathy, lumbosacral region: Secondary | ICD-10-CM | POA: Diagnosis not present

## 2024-03-22 NOTE — Therapy (Signed)
 OUTPATIENT PHYSICAL THERAPY THORACOLUMBAR EVALUATION  Patient Name: Kim Hudson MRN: [id number] DOB:Mar 13, 1991, 33 y.o., female Today's Date: 03/22/2024   PT End of Session - 03/22/24 1253     Visit Number 1    Number of Visits --   1-2x/week   Date for PT Re-Evaluation 05/17/24    Authorization Type [employer]    PT Start Time 1015    PT Stop Time 1055    PT Time Calculation (min) 40 min          Past Medical History:  Diagnosis Date   Back pain    GERD (gastroesophageal reflux disease)    Maternal obesity syndrome, antepartum, third trimester 03/03/2017   Obese    SVD (spontaneous vaginal delivery) 03/04/2017   Past Surgical History:  Procedure Laterality Date   BREAST REDUCTION SURGERY Bilateral 09/19/2023   Procedure: BREAST REDUCTION WITH LIPOSUCTION;  Surgeon: Lowery Estefana RAMAN, DO;  Location: [facility] OR;  Service: Plastics;  Laterality: Bilateral;   NO PAST SURGERIES     Patient Active Problem List   Diagnosis Date Noted   Symptomatic mammary hypertrophy 08/26/2023   Back pain 08/26/2023   Neck pain 08/26/2023   Normal pregnancy in multigravida in third trimester 03/04/2017   SVD (spontaneous vaginal delivery) 03/04/2017   Maternal obesity syndrome, antepartum, third trimester 03/03/2017    PCP: [facility]  REFERRING PROVIDER: Trudy Duwaine BRAVO, NP  THERAPY DIAG:  Other low back pain - Plan: PT plan of care cert/re-cert  Muscle weakness - Plan: PT plan of care cert/re-cert  Pain in both knees, unspecified chronicity - Plan: PT plan of care cert/re-cert  REFERRING DIAG: Chronic bilateral low back pain without sciatica [M54.50, G89.29], Spondylosis without myelopathy or radiculopathy, lumbosacral region [M47.817], Myofascial pain syndrome [M79.18]   Rationale for Evaluation and Treatment:  Rehabilitation  SUBJECTIVE:  PERTINENT PAST HISTORY:  none        PRECAUTIONS: None  WEIGHT BEARING RESTRICTIONS No  FALLS:  Has patient  fallen in last 6 months? No, Number of falls: 0  MOI/History of condition:  Onset date: Chronic for >10 years  SUBJECTIVE STATEMENT  EVAL:  Pt is a 33 y.o. female who presents to clinic with chief complaint of chronic low back pain.  Recent breast reduction in March.  Some help with upper back but no improvement in low back pain.  She sits on her bed with no back support throughout the day and feels it is difficult to sit with good posture.  She has been trying to do some exercises walking videos.  She is largely sedentary.  Denies significant LE sxs apart from knee pain.   Pain:  Are you having pain? Yes Pain location: low back pain NPRS scale:  Best: 0/10, Worst: 10/10 Aggravating factors: sitting, standing (10 min), doing dishes Relieving factors: resting, changes in position  Pain description: sharp and aching  Occupation: NA  Assistive Device: NA  Hand Dominance: NA  Patient Goals/Specific Activities: reduce pain, stand and sit longer   OBJECTIVE:   DIAGNOSTIC FINDINGS:  FINDINGS: There is no evidence of lumbar spine fracture. Alignment is normal. Intervertebral disc spaces are maintained.   IMPRESSION: No acute osseous abnormality.  GENERAL OBSERVATION/GAIT: Lumbar flexed seated posture  SENSATION: Light touch: Appears intact  LUMBAR AROM  AROM AROM  (Eval)  Flexion Fingertips to toes, w/ concordant pain  Extension WNL  Right lateral flexion WNL  Left lateral flexion WNL  Right rotation WNL  Left rotation WNL    (  Blank rows = not tested)   LE MMT:  MMT Right (Eval) Left (Eval)  Hip flexion (L2, L3) 4+ 4+  Knee extension (L3) 4* 4*  Knee flexion 4 4  Hip abduction    Hip extension    Hip external rotation    Hip internal rotation    Hip adduction    Ankle dorsiflexion (L4)    Ankle plantarflexion (S1)    Ankle inversion    Ankle eversion    Great Toe ext (L5)    Grossly     (Blank rows = not tested, score listed is out of 5  possible points.  N = WNL, D = diminished, C = clear for gross weakness with myotome testing, * = concordant pain with testing)  SPECIAL TESTS:  Straight leg raise: L (-), R (-) Slump: L (-), R (-)  LE ROM:  ROM Right (Eval) Left (Eval)  Hip flexion    Hip extension    Hip abduction    Hip adduction    Hip internal rotation    Hip external rotation    Knee flexion    Knee extension    Ankle dorsiflexion    Ankle plantarflexion    Ankle inversion    Ankle eversion      (Blank rows = not tested, N = WNL, * = concordant pain with testing)  Functional Tests  Eval    30'' STS: 9x  UE used? N    2 MWT: 402'                                                        PATIENT SURVEYS:  ODI: 31/50  TODAY'S TREATMENT  Therapeutic Exercise: Creating, reviewing, and completing below HEP  PATIENT EDUCATION ([place]/HM):  POC, diagnosis, prognosis, HEP, and outcome measures.  Pt educated via explanation, demonstration, and handout (HEP).  Pt confirms understanding verbally.   HOME EXERCISE PROGRAM: Access Code: [email or web address] URL: [email or web address] Date: 03/22/2024 Prepared by: Helene Gasmen  Exercises - Supine Lower Trunk Rotation  - 1 x daily - 7 x weekly - 1 sets - 20 reps - 3 hold - Supine Hip Adduction Isometric with Ball  - 1 x daily - 7 x weekly - 2 sets - 10 reps - 10'' hold - Hooklying Isometric Clamshell  - 1 x daily - 7 x weekly - 3 sets - 10 reps  Treatment priorities   Eval        Increase activity during the day        Get chair with back support and lumbar roll        General core/hip/LE strength                          ASSESSMENT:  CLINICAL IMPRESSION: Kim Hudson is a 33 y.o. female who presents to clinic with signs and sxs consistent with chronic low back pain.   No sign of radiculopathy on exam.   Appears mechanical in nature.  Kim Hudson will benefit from skilled PT to address relevant deficits and improve comfort with daily tasks  such as childcare and ambulation.   OBJECTIVE IMPAIRMENTS: Pain, LE and core strength, endurance, gait  ACTIVITY LIMITATIONS: walking, standing, sitting, sleeping, housework, childcare  PERSONAL FACTORS: See medical history and pertinent history   REHAB  POTENTIAL: Good  CLINICAL DECISION MAKING: Evolving/moderate complexity  EVALUATION COMPLEXITY: Moderate   GOALS:   SHORT TERM GOALS: Target date: 04/19/2024   Kim Hudson will be >75% HEP compliant to improve carryover between sessions and facilitate independent management of condition  Evaluation: ongoing Goal status: INITIAL   LONG TERM GOALS: Target date: 05/17/2024   Kim Hudson will self report >/= 50% decrease in pain from evaluation to improve function in daily tasks  Evaluation/Baseline: 10/10 max pain Goal status: INITIAL   2.  Kim Hudson will show a >/= 11 pt improvement in their ODI score (MCID is 12% or 6/50 pts) as a proxy for functional improvement   Evaluation/Baseline: 31/50 pts Goal status: INITIAL   3.  Kim Hudson will be able to stand >45 min to complete housework, not limited by pain  Evaluation/Baseline: limited Goal status: INITIAL   4.  Kim Hudson will improve two minute walk test to 460 feet (MCID 40 ft).  Evaluation/Baseline: 402 ft Goal status: INITIAL   5.  Yarisa will improve 30'' STS (MCID 2) to >/= 12x (w/ UE?: n) to show improved LE strength and improved transfers   Evaluation/Baseline: 9x  w/ UE? n Goal status: INITIAL   PLAN: PT FREQUENCY: 1-2x/week  PT DURATION: 8 weeks  PLANNED INTERVENTIONS:  97164- PT Re-evaluation, 97110-Therapeutic exercises, 97530- Therapeutic activity, [id number]- Neuromuscular re-education, 97535- Self Care, 02859- Manual therapy, [id number]- Gait training, [id number]- Aquatic Therapy, [phone]- Electrical stimulation (manual), [id number]- Vasopneumatic device, [id number]- Traction (mechanical), [id number]- Ionotophoresis 4mg /ml Dexamethasone , Taping, Dry Needling, Joint manipulation, and Spinal  manipulation.   Elston Aldape PT, DPT 03/22/2024, 1:20 PM

## 2024-03-23 ENCOUNTER — Encounter: Attending: Physical Medicine & Rehabilitation | Admitting: Physical Medicine & Rehabilitation

## 2024-03-23 ENCOUNTER — Encounter: Payer: Self-pay | Admitting: Physical Medicine & Rehabilitation

## 2024-03-23 ENCOUNTER — Other Ambulatory Visit: Payer: Self-pay

## 2024-03-23 ENCOUNTER — Other Ambulatory Visit (HOSPITAL_COMMUNITY): Payer: Self-pay

## 2024-03-23 VITALS — BP 122/84 | HR 72 | Ht 67.0 in | Wt 340.8 lb

## 2024-03-23 DIAGNOSIS — G8929 Other chronic pain: Secondary | ICD-10-CM | POA: Diagnosis present

## 2024-03-23 DIAGNOSIS — G479 Sleep disorder, unspecified: Secondary | ICD-10-CM | POA: Diagnosis present

## 2024-03-23 DIAGNOSIS — E66813 Obesity, class 3: Secondary | ICD-10-CM | POA: Insufficient documentation

## 2024-03-23 DIAGNOSIS — Z6841 Body Mass Index (BMI) 40.0 and over, adult: Secondary | ICD-10-CM | POA: Insufficient documentation

## 2024-03-23 DIAGNOSIS — M2550 Pain in unspecified joint: Secondary | ICD-10-CM | POA: Diagnosis not present

## 2024-03-23 DIAGNOSIS — M545 Low back pain, unspecified: Secondary | ICD-10-CM | POA: Insufficient documentation

## 2024-03-23 MED ORDER — PREGABALIN 75 MG PO CAPS
75.0000 mg | ORAL_CAPSULE | Freq: Two times a day (BID) | ORAL | 2 refills | Status: DC
  Filled 2024-03-23 (×2): qty 60, 30d supply, fill #0
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
## 2024-03-29 ENCOUNTER — Ambulatory Visit: Payer: Self-pay | Admitting: Physical Therapy

## 2024-04-04 ENCOUNTER — Encounter: Payer: Self-pay | Admitting: Nurse Practitioner

## 2024-04-04 ENCOUNTER — Ambulatory Visit (INDEPENDENT_AMBULATORY_CARE_PROVIDER_SITE_OTHER): Payer: Self-pay | Admitting: Nurse Practitioner

## 2024-04-04 ENCOUNTER — Other Ambulatory Visit (HOSPITAL_COMMUNITY)
Admission: RE | Admit: 2024-04-04 | Discharge: 2024-04-04 | Disposition: A | Discharge status: Home / Self Care | Source: Ambulatory Visit | Attending: Nurse Practitioner | Admitting: Nurse Practitioner

## 2024-04-04 ENCOUNTER — Encounter: Payer: Self-pay | Admitting: Physical Therapy

## 2024-04-04 ENCOUNTER — Ambulatory Visit: Admitting: Physical Therapy

## 2024-04-04 VITALS — BP 120/80 | HR 74 | Temp 98.3°F | Ht 67.0 in | Wt 336.2 lb

## 2024-04-04 DIAGNOSIS — M6281 Muscle weakness (generalized): Secondary | ICD-10-CM

## 2024-04-04 DIAGNOSIS — Z01419 Encounter for gynecological examination (general) (routine) without abnormal findings: Secondary | ICD-10-CM | POA: Diagnosis present

## 2024-04-04 DIAGNOSIS — M5459 Other low back pain: Secondary | ICD-10-CM | POA: Diagnosis not present

## 2024-04-04 DIAGNOSIS — M25561 Pain in right knee: Secondary | ICD-10-CM

## 2024-04-04 NOTE — Progress Notes (Signed)
 Subjective   Patient ID: Kim Hudson, female    DOB: 07/05/1991, 33 y.o.   MRN: [id number]  Chief Complaint  Patient presents with   Gynecologic Exam    Pt presents today for her Pap. No specific question or concerns      Referring provider: [facility], [facility]*  Kim Hudson is a 33 y.o. female with Past Medical History: No date: Back pain No date: GERD (gastroesophageal reflux disease) 03/03/2017: Maternal obesity syndrome, antepartum, third trimester No date: Obese 03/04/2017: SVD (spontaneous vaginal delivery)   Gynecologic Exam    Patient presents today for Pap smear.  She would like STD screening with that.  Overall she is doing well and has no significant issues or concerns today.  Tolerated Pap well with chaperone in the office today. Denies f/c/s, n/v/d, hemoptysis, PND, leg swelling Denies chest pain or edema     No Known Allergies   There is no immunization history on file for this patient.  Tobacco History: Social History   Tobacco Use  Smoking Status Some Days   Types: Cigars  Smokeless Tobacco Never   Ready to quit: Not Answered Counseling given: Not Answered   Outpatient Encounter Medications as of 04/04/2024  Medication Sig   baclofen  (LIORESAL ) 10 MG tablet Take 1 tablet (10 mg total) by mouth 3 (three) times daily.   meloxicam  (MOBIC ) 15 MG tablet Take 1 tablet (15 mg total) by mouth daily.   ondansetron  (ZOFRAN ) 4 MG tablet Take 1 tablet (4 mg total) by mouth every 8 (eight) hours as needed for nausea or vomiting.   pregabalin  (LYRICA ) 75 MG capsule Take 1 capsule (75 mg total) by mouth 2 (two) times daily.   Vitamin D , Ergocalciferol , (DRISDOL ) 1.25 MG (50000 UNIT) CAPS capsule Take 1 capsule (50,000 Units total) by mouth every 7 (seven) days.   [DISCONTINUED] omeprazole  (PRILOSEC) 20 MG capsule Take 1 po BID x 2 weeks then once a day (Patient not taking: Reported on 06/25/2014)   No facility-administered encounter medications on file  as of 04/04/2024.    Review of Systems  Review of Systems  Constitutional: Negative.   HENT: Negative.    Cardiovascular: Negative.   Gastrointestinal: Negative.   Allergic/Immunologic: Negative.   Neurological: Negative.   Psychiatric/Behavioral: Negative.       Objective:   BP 120/80 (BP Location: Right Arm, Patient Position: Sitting, Cuff Size: Large)   Pulse 74   Temp 98.3 F (36.8 C) (Oral)   Ht 5' 7 (1.702 m)   Wt (!) 336 lb 3.2 oz (152.5 kg)   SpO2 96%   BMI 52.66 kg/m   Wt Readings from Last 5 Encounters:  04/04/24 (!) 336 lb 3.2 oz (152.5 kg)  03/23/24 (!) 340 lb 12.8 oz (154.6 kg)  01/23/24 (!) 343 lb (155.6 kg)  09/19/23 (!) 323 lb (146.5 kg)  08/26/23 (!) 361 lb 12.8 oz (164.1 kg)     Physical Exam Vitals and nursing note reviewed.  Constitutional:      General: She is not in acute distress.    Appearance: She is well-developed.  Cardiovascular:     Rate and Rhythm: Normal rate and regular rhythm.  Pulmonary:     Effort: Pulmonary effort is normal.     Breath sounds: Normal breath sounds.  Neurological:     Mental Status: She is alert and oriented to person, place, and time.       Assessment & Plan:   Pap smear, as part of routine  gynecological examination -     Cytology - PAP     Return if symptoms worsen or fail to improve.   Bascom GORMAN Borer, NP 04/04/2024

## 2024-04-04 NOTE — Therapy (Signed)
 OUTPATIENT PHYSICAL THERAPY DAILY NOTE  Patient Name: Kim Hudson MRN: [id number] DOB:1991-02-04, 33 y.o., female Today's Date: 04/04/2024   PT End of Session - 04/04/24 0845     Visit Number 2    Number of Visits --   1-2x/week   Date for Recertification  05/17/24    Authorization Type [employer]    PT Start Time 0845    PT Stop Time 0926    PT Time Calculation (min) 41 min          Past Medical History:  Diagnosis Date   Back pain    GERD (gastroesophageal reflux disease)    Maternal obesity syndrome, antepartum, third trimester 03/03/2017   Obese    SVD (spontaneous vaginal delivery) 03/04/2017   Past Surgical History:  Procedure Laterality Date   BREAST REDUCTION SURGERY Bilateral 09/19/2023   Procedure: BREAST REDUCTION WITH LIPOSUCTION;  Surgeon: Kim Estefana RAMAN, DO;  Location: [facility] OR;  Service: Plastics;  Laterality: Bilateral;   NO PAST SURGERIES     Patient Active Problem List   Diagnosis Date Noted   Symptomatic mammary hypertrophy 08/26/2023   Back pain 08/26/2023   Neck pain 08/26/2023   Normal pregnancy in multigravida in third trimester 03/04/2017   SVD (spontaneous vaginal delivery) 03/04/2017   Maternal obesity syndrome, antepartum, third trimester 03/03/2017    PCP: Kim Hudson  REFERRING PROVIDER: Trudy Duwaine BRAVO, NP  THERAPY DIAG:  Other low back pain  Muscle weakness  Pain in both knees, unspecified chronicity  REFERRING DIAG: Chronic bilateral low back pain without sciatica [M54.50, G89.29], Spondylosis without myelopathy or radiculopathy, lumbosacral region [M47.817], Myofascial pain syndrome [M79.18]   Rationale for Evaluation and Treatment:  Rehabilitation  SUBJECTIVE:  PERTINENT PAST HISTORY:  none        PRECAUTIONS: None  WEIGHT BEARING RESTRICTIONS No  FALLS:  Has patient fallen in last 6 months? No, Number of falls: 0  MOI/History of condition:  Onset date: Chronic for >10 years  SUBJECTIVE  STATEMENT  04/04/2024: Pt reports that she has been taking medication to help with the pain.  This helps somewhat.  She has been trying to change positions which has helped somewhat.  EVAL:  Pt is a 33 y.o. female who presents to clinic with chief complaint of chronic low back pain.  Recent breast reduction in March.  Some help with upper back but no improvement in low back pain.  She sits on her bed with no back support throughout the day and feels it is difficult to sit with good posture.  She has been trying to do some exercises walking videos.  She is largely sedentary.  Denies significant LE sxs apart from knee pain.   Pain:  Are you having pain? Yes Pain location: low back pain NPRS scale:  Best: 0/10, Worst: 10/10 Aggravating factors: sitting, standing (10 min), doing dishes Relieving factors: resting, changes in position  Pain description: sharp and aching  Occupation: NA  Assistive Device: NA  Hand Dominance: NA  Patient Goals/Specific Activities: reduce pain, stand and sit longer   OBJECTIVE:   DIAGNOSTIC FINDINGS:  FINDINGS: There is no evidence of lumbar spine fracture. Alignment is normal. Intervertebral disc spaces are maintained.   IMPRESSION: No acute osseous abnormality.  GENERAL OBSERVATION/GAIT: Lumbar flexed seated posture  SENSATION: Light touch: Appears intact  LUMBAR AROM  AROM AROM  (Eval)  Flexion Fingertips to toes, w/ concordant pain  Extension WNL  Right lateral flexion WNL  Left lateral flexion  WNL  Right rotation WNL  Left rotation WNL    (Blank rows = not tested)   LE MMT:  MMT Right (Eval) Left (Eval)  Hip flexion (L2, L3) 4+ 4+  Knee extension (L3) 4* 4*  Knee flexion 4 4  Hip abduction    Hip extension    Hip external rotation    Hip internal rotation    Hip adduction    Ankle dorsiflexion (L4)    Ankle plantarflexion (S1)    Ankle inversion    Ankle eversion    Great Toe ext (L5)    Grossly     (Blank rows  = not tested, score listed is out of 5 possible points.  N = WNL, D = diminished, C = clear for gross weakness with myotome testing, * = concordant pain with testing)  SPECIAL TESTS:  Straight leg raise: L (-), R (-) Slump: L (-), R (-)  LE ROM:  ROM Right (Eval) Left (Eval)  Hip flexion    Hip extension    Hip abduction    Hip adduction    Hip internal rotation    Hip external rotation    Knee flexion    Knee extension    Ankle dorsiflexion    Ankle plantarflexion    Ankle inversion    Ankle eversion      (Blank rows = not tested, N = WNL, * = concordant pain with testing)  Functional Tests  Eval    30'' STS: 9x  UE used? N    2 MWT: 402'                                                        PATIENT SURVEYS:  ODI: 31/50  TODAY'S TREATMENT   [facility] Adult PT Treatment  04/04/2024:  Therapeutic Exercise: nu-step L6 20m while taking subjective and planning session with patient Supine clam - GTB Supine hip adduction SLR - 2x5 ea Bridge - 2x5 Standing row - YTB Standing shoulder ext - YTB  Therapeutic Activity  Education regarding graded exposure     HOME EXERCISE PROGRAM: Access Code: [email or web address] URL: [email or web address] Date: 03/22/2024 Prepared by: Kim Hudson  Exercises - Supine Lower Trunk Rotation  - 1 x daily - 7 x weekly - 1 sets - 20 reps - 3 hold - Supine Hip Adduction Isometric with Ball  - 1 x daily - 7 x weekly - 2 sets - 10 reps - 10'' hold - Hooklying Isometric Clamshell  - 1 x daily - 7 x weekly - 3 sets - 10 reps  Treatment priorities   Eval        Increase activity during the day        Get chair with back support and lumbar roll        General core/hip/LE strength                          ASSESSMENT:  CLINICAL IMPRESSION:  04/04/2024:  Pt with fair tolerance.  High levels of fatigue.  Mod fear avoidence but willing to work though some discomfort.  Encouraged continuing walking regularly at home  and completing HEP to tolerance.  EVAL:  Kim Hudson is a 33 y.o. female who presents to clinic with signs and sxs consistent with chronic low  back pain.   No sign of radiculopathy on exam.   Appears mechanical in nature.  Kim Hudson will benefit from skilled PT to address relevant deficits and improve comfort with daily tasks such as childcare and ambulation.   OBJECTIVE IMPAIRMENTS: Pain, LE and core strength, endurance, gait  ACTIVITY LIMITATIONS: walking, standing, sitting, sleeping, housework, childcare  PERSONAL FACTORS: See medical history and pertinent history   REHAB POTENTIAL: Good  CLINICAL DECISION MAKING: Evolving/moderate complexity  EVALUATION COMPLEXITY: Moderate   GOALS:   SHORT TERM GOALS: Target date: 04/19/2024   Tiffaney will be >75% HEP compliant to improve carryover between sessions and facilitate independent management of condition  Evaluation: ongoing Goal status: INITIAL   LONG TERM GOALS: Target date: 05/17/2024   Aubrynn will self report >/= 50% decrease in pain from evaluation to improve function in daily tasks  Evaluation/Baseline: 10/10 max pain Goal status: INITIAL   2.  Vallorie will show a >/= 11 pt improvement in their ODI score (MCID is 12% or 6/50 pts) as a proxy for functional improvement   Evaluation/Baseline: 31/50 pts Goal status: INITIAL   3.  Zakeria will be able to stand >45 min to complete housework, not limited by pain  Evaluation/Baseline: limited Goal status: INITIAL   4.  Lakina will improve two minute walk test to 460 feet (MCID 40 ft).  Evaluation/Baseline: 402 ft Goal status: INITIAL   5.  Kelvin will improve 30'' STS (MCID 2) to >/= 12x (w/ UE?: n) to show improved LE strength and improved transfers   Evaluation/Baseline: 9x  w/ UE? n Goal status: INITIAL   PLAN: PT FREQUENCY: 1-2x/week  PT DURATION: 8 weeks  PLANNED INTERVENTIONS:  [id number]- PT Re-evaluation, 97110-Therapeutic exercises, 97530- Therapeutic activity,  [id number]- Neuromuscular re-education, 97535- Self Hudson, [id number]- Manual therapy, [id number]- Gait training, [id number]- Aquatic Therapy, [id number]- Electrical stimulation (manual), [id number]- Vasopneumatic device, [id number]- Traction (mechanical), [id number]- Ionotophoresis 4mg /ml Dexamethasone , Taping, Dry Needling, Joint manipulation, and Spinal manipulation.   Cheryl Stabenow PT, DPT 04/04/2024, 9:29 AM

## 2024-04-06 ENCOUNTER — Ambulatory Visit: Admitting: Physical Therapy

## 2024-04-06 ENCOUNTER — Encounter: Payer: Self-pay | Admitting: Physical Therapy

## 2024-04-06 DIAGNOSIS — M5459 Other low back pain: Secondary | ICD-10-CM

## 2024-04-06 DIAGNOSIS — M6281 Muscle weakness (generalized): Secondary | ICD-10-CM

## 2024-04-06 DIAGNOSIS — M25561 Pain in right knee: Secondary | ICD-10-CM

## 2024-04-06 NOTE — Therapy (Signed)
 OUTPATIENT PHYSICAL THERAPY DAILY NOTE  Patient Name: Kim Hudson MRN: [id number] DOB:April 22, 1991, 33 y.o., female Today's Date: 04/06/2024   PT End of Session - 04/06/24 1006     Visit Number 3    Number of Visits --   1-2x/week   Date for Recertification  05/17/24    Authorization Type [employer]    PT Start Time 1005    PT Stop Time 1045    PT Time Calculation (min) 40 min          Past Medical History:  Diagnosis Date   Back pain    GERD (gastroesophageal reflux disease)    Maternal obesity syndrome, antepartum, third trimester 03/03/2017   Obese    SVD (spontaneous vaginal delivery) 03/04/2017   Past Surgical History:  Procedure Laterality Date   BREAST REDUCTION SURGERY Bilateral 09/19/2023   Procedure: BREAST REDUCTION WITH LIPOSUCTION;  Surgeon: Lowery Estefana RAMAN, DO;  Location: [facility] OR;  Service: Plastics;  Laterality: Bilateral;   NO PAST SURGERIES     Patient Active Problem List   Diagnosis Date Noted   Symptomatic mammary hypertrophy 08/26/2023   Back pain 08/26/2023   Neck pain 08/26/2023   Normal pregnancy in multigravida in third trimester 03/04/2017   SVD (spontaneous vaginal delivery) 03/04/2017   Maternal obesity syndrome, antepartum, third trimester 03/03/2017    [facility]P: [facility]  REFERRING PROVIDER: Trudy Duwaine BRAVO, NP  THERAPY DIAG:  Other low back pain  Muscle weakness  Pain in both knees, unspecified chronicity  REFERRING DIAG: Chronic bilateral low back pain without sciatica [M54.50, G89.29], Spondylosis without myelopathy or radiculopathy, lumbosacral region [M47.817], Myofascial pain syndrome [M79.18]   Rationale for Evaluation and Treatment:  Rehabilitation  SUBJECTIVE:  PERTINENT PAST HISTORY:  none        PRECAUTIONS: None  WEIGHT BEARING RESTRICTIONS No  FALLS:  Has patient fallen in last 6 months? No, Number of falls: 0  MOI/History of condition:  Onset date: Chronic for >10 years  SUBJECTIVE  STATEMENT  04/06/2024: Pt reports that she has had some pain in her mid back last night and today but was relieved somewhat by stretching.  Pt reports that she has had difficulty consistently walking d/t busy schedule and being tired.  EVAL:  Pt is a 33 y.o. female who presents to clinic with chief complaint of chronic low back pain.  Recent breast reduction in March.  Some help with upper back but no improvement in low back pain.  She sits on her bed with no back support throughout the day and feels it is difficult to sit with good posture.  She has been trying to do some exercises walking videos.  She is largely sedentary.  Denies significant LE sxs apart from knee pain.   Pain:  Are you having pain? Yes Pain location: low back pain NPRS scale:  Best: 0/10, Worst: 10/10 Aggravating factors: sitting, standing (10 min), doing dishes Relieving factors: resting, changes in position  Pain description: sharp and aching  Occupation: NA  Assistive Device: NA  Hand Dominance: NA  Patient Goals/Specific Activities: reduce pain, stand and sit longer   OBJECTIVE:   DIAGNOSTIC FINDINGS:  FINDINGS: There is no evidence of lumbar spine fracture. Alignment is normal. Intervertebral disc spaces are maintained.   IMPRESSION: No acute osseous abnormality.  GENERAL OBSERVATION/GAIT: Lumbar flexed seated posture  SENSATION: Light touch: Appears intact  LUMBAR AROM  AROM AROM  (Eval)  Flexion Fingertips to toes, w/ concordant pain  Extension WNL  Right lateral flexion WNL  Left lateral flexion WNL  Right rotation WNL  Left rotation WNL    (Blank rows = not tested)   LE MMT:  MMT Right (Eval) Left (Eval)  Hip flexion (L2, L3) 4+ 4+  Knee extension (L3) 4* 4*  Knee flexion 4 4  Hip abduction    Hip extension    Hip external rotation    Hip internal rotation    Hip adduction    Ankle dorsiflexion (L4)    Ankle plantarflexion (S1)    Ankle inversion    Ankle eversion     Great Toe ext (L5)    Grossly     (Blank rows = not tested, score listed is out of 5 possible points.  N = WNL, D = diminished, C = clear for gross weakness with myotome testing, * = concordant pain with testing)  SPECIAL TESTS:  Straight leg raise: L (-), R (-) Slump: L (-), R (-)  LE ROM:  ROM Right (Eval) Left (Eval)  Hip flexion    Hip extension    Hip abduction    Hip adduction    Hip internal rotation    Hip external rotation    Knee flexion    Knee extension    Ankle dorsiflexion    Ankle plantarflexion    Ankle inversion    Ankle eversion      (Blank rows = not tested, N = WNL, * = concordant pain with testing)  Functional Tests  Eval    30'' STS: 9x  UE used? N    2 MWT: 402'                                                        PATIENT SURVEYS:  ODI: 31/50  TODAY'S TREATMENT   [facility] Adult PT Treatment  04/06/2024:  Therapeutic Exercise: nu-step L6 27m while taking subjective and planning session with patient Supine clam - Black TB Supine hip adduction SLR - 2x5 ea LTR - 10x ea Bridge - 3x5 Supine abdominal ball push - 10x 5'' hold Open book - 10x ea Supine horizontal abd - RTB - 2x10 Supine diagonals - RTB Standing row - YTB Standing shoulder ext - YTB  Therapeutic Activity  STS 3x5     HOME EXERCISE PROGRAM: Access Code: [email or web address] URL: [email or web address] Date: 04/06/2024 Prepared by: Helene Gasmen  Exercises - Supine Lower Trunk Rotation  - 1 x daily - 7 x weekly - 1 sets - 20 reps - 3 hold - Hooklying Isometric Clamshell  - 1 x daily - 7 x weekly - 3 sets - 10 reps - Sit to Stand Without Arm Support  - 1 x daily - 4-7 x weekly - 3 sets - 5 reps - Supine Shoulder Horizontal Abduction with Resistance  - 1 x daily - 4-7 x weekly - 3 sets - 10 reps - Walking on Treadmill  - 1 x daily - 4-7 x weekly - 10 reps - 5-10 min time  Treatment priorities   Eval        Increase activity during the day         Get chair with back support and lumbar roll        General core/hip/LE strength  ASSESSMENT:  CLINICAL IMPRESSION:  04/06/2024:  Added volume to exercises today with pt noting fatigue but no significant increase in pain.  Encouraged walking at least 5 min a day and regular completion of exercises.  Pt plans on trying to do this regularly.  EVAL:  Kaede is a 33 y.o. female who presents to clinic with signs and sxs consistent with chronic low back pain.   No sign of radiculopathy on exam.   Appears mechanical in nature.  Melenda will benefit from skilled PT to address relevant deficits and improve comfort with daily tasks such as childcare and ambulation.   OBJECTIVE IMPAIRMENTS: Pain, LE and core strength, endurance, gait  ACTIVITY LIMITATIONS: walking, standing, sitting, sleeping, housework, childcare  PERSONAL FACTORS: See medical history and pertinent history   REHAB POTENTIAL: Good  CLINICAL DECISION MAKING: Evolving/moderate complexity  EVALUATION COMPLEXITY: Moderate   GOALS:   SHORT TERM GOALS: Target date: 04/19/2024   Taraoluwa will be >75% HEP compliant to improve carryover between sessions and facilitate independent management of condition  Evaluation: ongoing Goal status: INITIAL   LONG TERM GOALS: Target date: 05/17/2024   Ileanna will self report >/= 50% decrease in pain from evaluation to improve function in daily tasks  Evaluation/Baseline: 10/10 max pain Goal status: INITIAL   2.  Mazikeen will show a >/= 11 pt improvement in their ODI score (MCID is 12% or 6/50 pts) as a proxy for functional improvement   Evaluation/Baseline: 31/50 pts Goal status: INITIAL   3.  Mckinze will be able to stand >45 min to complete housework, not limited by pain  Evaluation/Baseline: limited Goal status: INITIAL   4.  Jamilett will improve two minute walk test to 460 feet (MCID 40 ft).  Evaluation/Baseline: 402 ft Goal status:  INITIAL   5.  Jaquala will improve 30'' STS (MCID 2) to >/= 12x (w/ UE?: n) to show improved LE strength and improved transfers   Evaluation/Baseline: 9x  w/ UE? n Goal status: INITIAL   PLAN: PT FREQUENCY: 1-2x/week  PT DURATION: 8 weeks  PLANNED INTERVENTIONS:  97164- PT Re-evaluation, 97110-Therapeutic exercises, 97530- Therapeutic activity, [id number]- Neuromuscular re-education, 97535- Self Care, 02859- Manual therapy, [id number]- Gait training, [id number]- Aquatic Therapy, [phone]- Electrical stimulation (manual), [id number]- Vasopneumatic device, [id number]- Traction (mechanical), [id number]- Ionotophoresis 4mg /ml Dexamethasone , Taping, Dry Needling, Joint manipulation, and Spinal manipulation.   Hinda Lindor PT, DPT 04/06/2024, 10:50 AM

## 2024-04-09 ENCOUNTER — Other Ambulatory Visit: Payer: Self-pay

## 2024-04-09 ENCOUNTER — Ambulatory Visit: Payer: Self-pay

## 2024-04-09 ENCOUNTER — Other Ambulatory Visit: Payer: Self-pay | Admitting: Nurse Practitioner

## 2024-04-09 DIAGNOSIS — G8929 Other chronic pain: Secondary | ICD-10-CM

## 2024-04-09 NOTE — Telephone Encounter (Signed)
 FYI Only or Action Required?: FYI only for provider. [facility]-  Patient was last seen in primary care on 04/04/2024 by Oley Bascom RAMAN, NP.  Called Nurse Triage reporting Dental Pain.  Symptoms began a week ago.  Interventions attempted: OTC medications: Excedrin.  Symptoms are: gradually worsening.  Triage Disposition: See HCP Within 4 Hours (Or PCP Triage)  Patient/caregiver understands and will follow disposition?: Yes  Copied from CRM #[id number]. Topic: Clinical - Pink Word Triage >> Apr 09, 2024  3:14 PM DeAngela L wrote: Reason for Triage: patient calling to ask if she could be prescribe antibiotics for her tooth ache pain and gum swelling really bad head aches her previous dental office no longer accept medicaid  While she is waiting on a dental referral appointment to be scheduled the patient is asking for antibiotics to help with pain and swelling   Pt num [phone] (M) >> Apr 09, 2024  3:41 PM Emylou G wrote: Patient is a bit confused?  We see there is a referral.. Is she getting pain medication from us ?  She said she is pain? >> Apr 09, 2024  3:16 PM DeAngela L wrote: patient calling to ask if she could be prescribe antibiotics for her tooth ache pain and gum swelling really bad head aches her previous dental office no longer accept medicaid  While she is waiting on a dental referral appointment to be scheduled the patient is asking for antibiotics to help with pain and swelling   Pt num [phone] (M) Reason for Disposition  [1] SEVERE pain (e.g., excruciating, unable to eat, unable to do any normal activities) AND [2] not improved 2 hours after pain medicine  Answer Assessment - Initial Assessment Questions Patient has new referral to alternate Dental office. Has cracked tooth and gum swelling/pain. Patient asking for antibiotic and pain meds to help until she can be seen by new dentist.  1. LOCATION: Which tooth is hurting?  (e.g., right-side/left-side,  upper/lower, front/back)     Right front upper gum/tooth pain sensitivity- crack in her tooth 2. ONSET: When did the toothache start?  (e.g., hours, days)      Last week  3. SEVERITY: How bad is the toothache?  (Scale 1-10; mild, moderate or severe)     10/10 pain - Excedrin with minimal improvement 4. SWELLING: Is there any visible swelling of your face?     Can tell her face is swollen  5. OTHER SYMPTOMS: Do you have any other symptoms? (e.g., fever)     White dot/ substance 6. PREGNANCY: Is there any chance you are pregnant? When was your last menstrual period?     A few weeks ago.  Protocols used: Toothache-A-AH

## 2024-04-09 NOTE — Telephone Encounter (Addendum)
 Attempted to contact x3, no contact made. Routing to clinic.    Message from [facility] sent at 04/09/2024  3:16 PM EDT  Summary: pain   patient calling to ask if she could be prescribe antibiotics for her tooth ache pain and gum swelling really bad head aches her previous dental office no longer accept medicaid While she is waiting on a dental referral appointment to be scheduled the patient is asking for antibiotics to help with pain and swelling  Pt num [phone] (M)

## 2024-04-09 NOTE — Telephone Encounter (Signed)
 Copied from CRM [id number]. Topic: Clinical - Pink Word Triage >> Apr 09, 2024  3:14 PM DeAngela L wrote: Reason for Triage: patient calling to ask if she could be prescribe antibiotics for her tooth ache pain and gum swelling really bad head aches her previous dental office no longer accept medicaid  While she is waiting on a dental referral appointment to be scheduled the patient is asking for antibiotics to help with pain and swelling   Pt num [phone] (M) >> Apr 09, 2024  3:16 PM DeAngela L wrote: patient calling to ask if she could be prescribe antibiotics for her tooth ache pain and gum swelling really bad head aches her previous dental office no longer accept medicaid  While she is waiting on a dental referral appointment to be scheduled the patient is asking for antibiotics to help with pain and swelling   Pt num [phone] (M)

## 2024-04-09 NOTE — Therapy (Incomplete)
 OUTPATIENT PHYSICAL THERAPY DAILY NOTE  Patient Name: Kim Hudson MRN: [id number] DOB:11/23/1990, 33 y.o., female Today's Date: 04/09/2024     Past Medical History:  Diagnosis Date   Back pain    GERD (gastroesophageal reflux disease)    Maternal obesity syndrome, antepartum, third trimester 03/03/2017   Obese    SVD (spontaneous vaginal delivery) 03/04/2017   Past Surgical History:  Procedure Laterality Date   BREAST REDUCTION SURGERY Bilateral 09/19/2023   Procedure: BREAST REDUCTION WITH LIPOSUCTION;  Surgeon: Lowery Estefana RAMAN, DO;  Location: [facility] OR;  Service: Plastics;  Laterality: Bilateral;   NO PAST SURGERIES     Patient Active Problem List   Diagnosis Date Noted   Symptomatic mammary hypertrophy 08/26/2023   Back pain 08/26/2023   Neck pain 08/26/2023   Normal pregnancy in multigravida in third trimester 03/04/2017   SVD (spontaneous vaginal delivery) 03/04/2017   Maternal obesity syndrome, antepartum, third trimester 03/03/2017    [facility]  REFERRING PROVIDER: Trudy Duwaine BRAVO, NP  THERAPY DIAG:  No diagnosis found.  REFERRING DIAG: Chronic bilateral low back pain without sciatica [M54.50, G89.29], Spondylosis without myelopathy or radiculopathy, lumbosacral region [M47.817], Myofascial pain syndrome [M79.18]   Rationale for Evaluation and Treatment:  Rehabilitation  SUBJECTIVE:  PERTINENT PAST HISTORY:  none        PRECAUTIONS: None  WEIGHT BEARING RESTRICTIONS No  FALLS:  Has patient fallen in last 6 months? No, Number of falls: 0  MOI/History of condition:  Onset date: Chronic for >10 years  SUBJECTIVE STATEMENT   04/09/2024: Pt reports that she has had some pain in her mid back last night and today but was relieved somewhat by stretching.  Pt reports that she has had difficulty consistently walking d/t busy schedule and being tired.  EVAL:  Pt is a 33 y.o. female who presents to clinic with chief complaint of  chronic low back pain.  Recent breast reduction in March.  Some help with upper back but no improvement in low back pain.  She sits on her bed with no back support throughout the day and feels it is difficult to sit with good posture.  She has been trying to do some exercises walking videos.  She is largely sedentary.  Denies significant LE sxs apart from knee pain.   Pain:  Are you having pain? Yes Pain location: low back pain NPRS scale:  Best: 0/10, Worst: 10/10 Aggravating factors: sitting, standing (10 min), doing dishes Relieving factors: resting, changes in position  Pain description: sharp and aching  Occupation: NA  Assistive Device: NA  Hand Dominance: NA  Patient Goals/Specific Activities: reduce pain, stand and sit longer   OBJECTIVE:   DIAGNOSTIC FINDINGS:  FINDINGS: There is no evidence of lumbar spine fracture. Alignment is normal. Intervertebral disc spaces are maintained.   IMPRESSION: No acute osseous abnormality.  GENERAL OBSERVATION/GAIT: Lumbar flexed seated posture  SENSATION: Light touch: Appears intact  LUMBAR AROM  AROM AROM  (Eval)  Flexion Fingertips to toes, w/ concordant pain  Extension WNL  Right lateral flexion WNL  Left lateral flexion WNL  Right rotation WNL  Left rotation WNL    (Blank rows = not tested)   LE MMT:  MMT Right (Eval) Left (Eval)  Hip flexion (L2, L3) 4+ 4+  Knee extension (L3) 4* 4*  Knee flexion 4 4  Hip abduction    Hip extension    Hip external rotation    Hip internal rotation  Hip adduction    Ankle dorsiflexion (L4)    Ankle plantarflexion (S1)    Ankle inversion    Ankle eversion    Great Toe ext (L5)    Grossly     (Blank rows = not tested, score listed is out of 5 possible points.  N = WNL, D = diminished, C = clear for gross weakness with myotome testing, * = concordant pain with testing)  SPECIAL TESTS:  Straight leg raise: L (-), R (-) Slump: L (-), R (-)  LE ROM:  ROM  Right (Eval) Left (Eval)  Hip flexion    Hip extension    Hip abduction    Hip adduction    Hip internal rotation    Hip external rotation    Knee flexion    Knee extension    Ankle dorsiflexion    Ankle plantarflexion    Ankle inversion    Ankle eversion      (Blank rows = not tested, N = WNL, * = concordant pain with testing)  Functional Tests  Eval    30'' STS: 9x  UE used? N    2 MWT: 402'                                                        PATIENT SURVEYS:  ODI: 31/50  TODAY'S TREATMENT  [facility] Adult PT Treatment:                                                DATE: 04/10/24 Therapeutic Exercise: nu-step L6 56m while taking subjective and planning session with patient Supine clam - Black TB Supine hip adduction SLR - 2x5 ea LTR - 10x ea Bridge - 3x5 Supine abdominal ball push - 10x 5'' hold Open book - 10x ea Supine horizontal abd - RTB - 2x10 Supine diagonals - RTB Standing row - YTB Standing shoulder ext - YTB  Therapeutic Activity  STS 3x5    Therapeutic Exercise: *** Manual Therapy: *** Neuromuscular re-ed: *** Therapeutic Activity: *** Modalities: *** Self Care: ***  [facility] Adult PT Treatment  04/06/2024:  Therapeutic Exercise: nu-step L6 32m while taking subjective and planning session with patient Supine clam - Black TB Supine hip adduction SLR - 2x5 ea LTR - 10x ea Bridge - 3x5 Supine abdominal ball push - 10x 5'' hold Open book - 10x ea Supine horizontal abd - RTB - 2x10 Supine diagonals - RTB Standing row - YTB Standing shoulder ext - YTB  Therapeutic Activity  STS 3x5     HOME EXERCISE PROGRAM: Access Code: [email or web address] URL: [email or web address] Date: 04/06/2024 Prepared by: Helene Gasmen  Exercises - Supine Lower Trunk Rotation  - 1 x daily - 7 x weekly - 1 sets - 20 reps - 3 hold - Hooklying Isometric Clamshell  - 1 x daily - 7 x weekly - 3 sets - 10 reps - Sit to Stand Without Arm  Support  - 1 x daily - 4-7 x weekly - 3 sets - 5 reps - Supine Shoulder Horizontal Abduction with Resistance  - 1 x daily - 4-7 x weekly - 3 sets - 10 reps - Walking on Treadmill  -  1 x daily - 4-7 x weekly - 10 reps - 5-10 min time  Treatment priorities   Eval        Increase activity during the day        Get chair with back support and lumbar roll        General core/hip/LE strength                          ASSESSMENT:  CLINICAL IMPRESSION:    04/09/2024:  Added volume to exercises today with pt noting fatigue but no significant increase in pain.  Encouraged walking at least 5 min a day and regular completion of exercises.  Pt plans on trying to do this regularly.  EVAL:  Kim Hudson is a 33 y.o. female who presents to clinic with signs and sxs consistent with chronic low back pain.   No sign of radiculopathy on exam.   Appears mechanical in nature.  Kim Hudson will benefit from skilled PT to address relevant deficits and improve comfort with daily tasks such as childcare and ambulation.   OBJECTIVE IMPAIRMENTS: Pain, LE and core strength, endurance, gait  ACTIVITY LIMITATIONS: walking, standing, sitting, sleeping, housework, childcare  PERSONAL FACTORS: See medical history and pertinent history   REHAB POTENTIAL: Good  CLINICAL DECISION MAKING: Evolving/moderate complexity  EVALUATION COMPLEXITY: Moderate   GOALS:   SHORT TERM GOALS: Target date: 04/19/2024   Kim Hudson will be >75% HEP compliant to improve carryover between sessions and facilitate independent management of condition  Evaluation: ongoing Goal status: INITIAL   LONG TERM GOALS: Target date: 05/17/2024   Kim Hudson will self report >/= 50% decrease in pain from evaluation to improve function in daily tasks  Evaluation/Baseline: 10/10 max pain Goal status: INITIAL   2.  Kim Hudson will show a >/= 11 pt improvement in their ODI score (MCID is 12% or 6/50 pts) as a proxy for functional improvement    Evaluation/Baseline: 31/50 pts Goal status: INITIAL   3.  Kim Hudson will be able to stand >45 min to complete housework, not limited by pain  Evaluation/Baseline: limited Goal status: INITIAL   4.  Kim Hudson will improve two minute walk test to 460 feet (MCID 40 ft).  Evaluation/Baseline: 402 ft Goal status: INITIAL   5.  Kim Hudson will improve 30'' STS (MCID 2) to >/= 12x (w/ UE?: n) to show improved LE strength and improved transfers   Evaluation/Baseline: 9x  w/ UE? n Goal status: INITIAL   PLAN: PT FREQUENCY: 1-2x/week  PT DURATION: 8 weeks  PLANNED INTERVENTIONS:  97164- PT Re-evaluation, 97110-Therapeutic exercises, 97530- Therapeutic activity, [id number]- Neuromuscular re-education, 97535- Self Care, 02859- Manual therapy, [id number]- Gait training, [id number]- Aquatic Therapy, [phone]- Electrical stimulation (manual), [id number]- Vasopneumatic device, [id number]- Traction (mechanical), [id number]- Ionotophoresis 4mg /ml Dexamethasone , Taping, Dry Needling, Joint manipulation, and Spinal manipulation.   Faigy Stretch MS, PT 04/09/24 10:54 AM

## 2024-04-10 ENCOUNTER — Ambulatory Visit (HOSPITAL_COMMUNITY)
Admission: RE | Admit: 2024-04-10 | Discharge: 2024-04-10 | Disposition: A | Discharge status: Home / Self Care | Source: Ambulatory Visit | Attending: Family Medicine | Admitting: Family Medicine

## 2024-04-10 ENCOUNTER — Ambulatory Visit

## 2024-04-10 ENCOUNTER — Encounter (HOSPITAL_COMMUNITY): Payer: Self-pay

## 2024-04-10 ENCOUNTER — Ambulatory Visit (HOSPITAL_COMMUNITY)

## 2024-04-10 VITALS — BP 148/87 | HR 76 | Temp 98.9°F | Resp 16

## 2024-04-10 DIAGNOSIS — N898 Other specified noninflammatory disorders of vagina: Secondary | ICD-10-CM | POA: Insufficient documentation

## 2024-04-10 NOTE — ED Provider Notes (Signed)
 [facility]   [id number] 04/10/24 Arrival Time: 9074  ASSESSMENT & PLAN:  1. Vaginal irritation    No empiric treatment.   Without s/s of PID.  Labs Pending:  CERVICOVAGINAL ANCILLARY ONLY   Will notify of any positive results. Instructed to refrain from sexual activity for at least seven days.  Reviewed expectations re: course of current medical issues. Questions answered. Outlined signs and symptoms indicating need for more acute intervention. Patient verbalized understanding. After Visit Summary given.   SUBJECTIVE:  Kim Hudson is a 33 y.o. female who presents with complaint of vaginal irritation/ques slight discharge; sev days. No tx PTA. Desires STI testing.  Patient's last menstrual period was 03/27/2024 (approximate).   OBJECTIVE:  Vitals:   04/10/24 0943  BP: (!) 148/87  Pulse: 76  Resp: 16  Temp: 98.9 F (37.2 C)  TempSrc: Oral  SpO2: 95%     General appearance: alert, cooperative, appears stated age and no distress Lungs: unlabored respirations; speaks full sentences without difficulty Back: no CVA tenderness; FROM at waist Abdomen: soft, non-tender GU: declined Skin: warm and dry Psychological: alert and cooperative; normal mood and affect.    Labs Reviewed  CERVICOVAGINAL ANCILLARY ONLY    No Known Allergies  Past Medical History:  Diagnosis Date   Back pain    GERD (gastroesophageal reflux disease)    Maternal obesity syndrome, antepartum, third trimester 03/03/2017   Obese    SVD (spontaneous vaginal delivery) 03/04/2017   Family History  Problem Relation Age of Onset   Hypertension Mother    Social History   Socioeconomic History   Marital status: Single    Spouse name: Not on file   Number of children: Not on file   Years of education: Not on file   Highest education level: GED or equivalent  Occupational History   Not on file  Tobacco Use   Smoking status: Former    Types: Cigars   Smokeless tobacco:  Never  Vaping Use   Vaping status: Never Used  Substance and Sexual Activity   Alcohol use: No   Drug use: No   Sexual activity: Yes    Birth control/protection: None  Other Topics Concern   Not on file  Social History Narrative   Not on file   Social Drivers of Health   Financial Resource Strain: Low Risk  (01/19/2024)   Overall Financial Resource Strain (CARDIA)    Difficulty of Paying Living Expenses: Not hard at all  Food Insecurity: No Food Insecurity (01/19/2024)   Hunger Vital Sign    Worried About Running Out of Food in the Last Year: Never true    Ran Out of Food in the Last Year: Never true  Transportation Needs: No Transportation Needs (01/19/2024)   PRAPARE - Administrator, Civil Service (Medical): No    Lack of Transportation (Non-Medical): No  Physical Activity: Sufficiently Active (01/19/2024)   Exercise Vital Sign    Days of Exercise per Week: 7 days    Minutes of Exercise per Session: 30 min  Recent Concern: Physical Activity - Insufficiently Active (12/15/2023)   Exercise Vital Sign    Days of Exercise per Week: 3 days    Minutes of Exercise per Session: 30 min  Stress: No Stress Concern Present (01/19/2024)   [employer] of Occupational Health - Occupational Stress Questionnaire    Feeling of Stress: Not at all  Social Connections: Socially Isolated (01/19/2024)   Social Connection and Isolation Panel  Frequency of Communication with Friends and Family: Once a week    Frequency of Social Gatherings with Friends and Family: Never    Attends Religious Services: Never    Database administrator or Organizations: No    Attends Banker Meetings: Not on file    Marital Status: Never married  Intimate Partner Violence: Not on file           Rolinda Rogue, MD 04/10/24 1038

## 2024-04-10 NOTE — Telephone Encounter (Signed)
 Patient at [facility] for evaluation.

## 2024-04-10 NOTE — ED Triage Notes (Signed)
 Patient here today with c/o vaginal itching X 3-5 days. Patient states that she shaved and the irritation worsened. Patient states that she was also recently with a new partner and wanted to get tested for Stds.

## 2024-04-11 ENCOUNTER — Other Ambulatory Visit: Payer: Self-pay

## 2024-04-11 ENCOUNTER — Ambulatory Visit (HOSPITAL_COMMUNITY): Payer: Self-pay

## 2024-04-11 LAB — CERVICOVAGINAL ANCILLARY ONLY
Bacterial Vaginitis (gardnerella): POSITIVE — AB
Candida Glabrata: NEGATIVE
Candida Vaginitis: POSITIVE — AB
Chlamydia: NEGATIVE
Comment: NEGATIVE
Comment: NEGATIVE
Comment: NEGATIVE
Comment: NEGATIVE
Comment: NEGATIVE
Comment: NORMAL
Neisseria Gonorrhea: NEGATIVE
Trichomonas: NEGATIVE

## 2024-04-11 MED ORDER — FLUCONAZOLE 150 MG PO TABS
150.0000 mg | ORAL_TABLET | Freq: Once | ORAL | 0 refills | Status: AC
  Filled 2024-04-11: qty 2, 2d supply, fill #0

## 2024-04-11 MED ORDER — METRONIDAZOLE 500 MG PO TABS
500.0000 mg | ORAL_TABLET | Freq: Two times a day (BID) | ORAL | 0 refills | Status: AC
  Filled 2024-04-11: qty 14, 7d supply, fill #0

## 2024-04-13 ENCOUNTER — Ambulatory Visit: Admitting: Physical Therapy

## 2024-04-13 ENCOUNTER — Ambulatory Visit: Admitting: Physical Medicine and Rehabilitation

## 2024-04-17 ENCOUNTER — Ambulatory Visit

## 2024-04-18 ENCOUNTER — Ambulatory Visit: Payer: Self-pay | Admitting: Physical Therapy

## 2024-04-19 ENCOUNTER — Ambulatory Visit: Attending: Physical Medicine and Rehabilitation | Admitting: Physical Therapy

## 2024-04-19 DIAGNOSIS — M6281 Muscle weakness (generalized): Secondary | ICD-10-CM | POA: Insufficient documentation

## 2024-04-19 DIAGNOSIS — M25562 Pain in left knee: Secondary | ICD-10-CM | POA: Insufficient documentation

## 2024-04-19 DIAGNOSIS — M25561 Pain in right knee: Secondary | ICD-10-CM | POA: Insufficient documentation

## 2024-04-19 DIAGNOSIS — M5459 Other low back pain: Secondary | ICD-10-CM | POA: Insufficient documentation

## 2024-04-20 LAB — CYTOLOGY - PAP
Adequacy: ABSENT
Chlamydia: NEGATIVE
Comment: NEGATIVE
Comment: NEGATIVE
Comment: NEGATIVE
Comment: NEGATIVE
Comment: NORMAL
Diagnosis: UNDETERMINED — AB
HSV1: NEGATIVE
HSV2: NEGATIVE
High risk HPV: NEGATIVE
Neisseria Gonorrhea: NEGATIVE
Trichomonas: NEGATIVE

## 2024-04-24 ENCOUNTER — Encounter: Payer: Self-pay | Admitting: Physical Therapy

## 2024-04-24 ENCOUNTER — Ambulatory Visit: Admitting: Physical Therapy

## 2024-04-24 DIAGNOSIS — M6281 Muscle weakness (generalized): Secondary | ICD-10-CM

## 2024-04-24 DIAGNOSIS — M25561 Pain in right knee: Secondary | ICD-10-CM | POA: Diagnosis present

## 2024-04-24 DIAGNOSIS — M25562 Pain in left knee: Secondary | ICD-10-CM | POA: Diagnosis present

## 2024-04-24 DIAGNOSIS — M5459 Other low back pain: Secondary | ICD-10-CM

## 2024-04-24 NOTE — Therapy (Signed)
 OUTPATIENT PHYSICAL THERAPY DAILY NOTE  Patient Name: Kim Hudson MRN: [id number] DOB:1990/12/16, 33 y.o., female Today's Date: 04/24/2024   PT End of Session - 04/24/24 0757     Visit Number 4    Number of Visits --   1-2x/week   Date for Recertification  05/17/24    Authorization Type [employer]    Authorization Time Period approved 10 PT visits from 04/02/24-06/01/24    PT Start Time 0800    PT Stop Time 0841    PT Time Calculation (min) 41 min          Past Medical History:  Diagnosis Date   Back pain    GERD (gastroesophageal reflux disease)    Maternal obesity syndrome, antepartum, third trimester 03/03/2017   Obese    SVD (spontaneous vaginal delivery) 03/04/2017   Past Surgical History:  Procedure Laterality Date   BREAST REDUCTION SURGERY Bilateral 09/19/2023   Procedure: BREAST REDUCTION WITH LIPOSUCTION;  Surgeon: Lowery Estefana RAMAN, DO;  Location: [facility] OR;  Service: Plastics;  Laterality: Bilateral;   NO PAST SURGERIES     Patient Active Problem List   Diagnosis Date Noted   Symptomatic mammary hypertrophy 08/26/2023   Back pain 08/26/2023   Neck pain 08/26/2023   Normal pregnancy in multigravida in third trimester 03/04/2017   SVD (spontaneous vaginal delivery) 03/04/2017   Maternal obesity syndrome, antepartum, third trimester 03/03/2017    PCP: Oley Bascom RAMAN, NP  REFERRING PROVIDER: Trudy Duwaine BRAVO, NP  THERAPY DIAG:  Other low back pain  Muscle weakness  Pain in both knees, unspecified chronicity  REFERRING DIAG: Chronic bilateral low back pain without sciatica [M54.50, G89.29], Spondylosis without myelopathy or radiculopathy, lumbosacral region [M47.817], Myofascial pain syndrome [M79.18]   Rationale for Evaluation and Treatment:  Rehabilitation  SUBJECTIVE:  PERTINENT PAST HISTORY:  none        PRECAUTIONS: None  WEIGHT BEARING RESTRICTIONS No  FALLS:  Has patient fallen in last 6 months? No, Number of falls: 0  MOI/History of  condition:  Onset date: Chronic for >10 years  SUBJECTIVE STATEMENT  04/24/2024: Pt reports that she has been very busy and has had difficulty with completing HEP.  EVAL:  Pt is a 33 y.o. female who presents to clinic with chief complaint of chronic low back pain.  Recent breast reduction in March.  Some help with upper back but no improvement in low back pain.  She sits on her bed with no back support throughout the day and feels it is difficult to sit with good posture.  She has been trying to do some exercises walking videos.  She is largely sedentary.  Denies significant LE sxs apart from knee pain.   Pain:  Are you having pain? Yes Pain location: low back pain NPRS scale:  Best: 0/10, Worst: 10/10 Aggravating factors: sitting, standing (10 min), doing dishes Relieving factors: resting, changes in position  Pain description: sharp and aching  Occupation: NA  Assistive Device: NA  Hand Dominance: NA  Patient Goals/Specific Activities: reduce pain, stand and sit longer   OBJECTIVE:   DIAGNOSTIC FINDINGS:  FINDINGS: There is no evidence of lumbar spine fracture. Alignment is normal. Intervertebral disc spaces are maintained.   IMPRESSION: No acute osseous abnormality.  GENERAL OBSERVATION/GAIT: Lumbar flexed seated posture  SENSATION: Light touch: Appears intact  LUMBAR AROM  AROM AROM  (Eval)  Flexion Fingertips to toes, w/ concordant pain  Extension WNL  Right lateral flexion WNL  Left lateral flexion WNL  Right  rotation WNL  Left rotation WNL    (Blank rows = not tested)   LE MMT:  MMT Right (Eval) Left (Eval)  Hip flexion (L2, L3) 4+ 4+  Knee extension (L3) 4* 4*  Knee flexion 4 4  Hip abduction    Hip extension    Hip external rotation    Hip internal rotation    Hip adduction    Ankle dorsiflexion (L4)    Ankle plantarflexion (S1)    Ankle inversion    Ankle eversion    Great Toe ext (L5)    Grossly     (Blank rows = not tested,  score listed is out of 5 possible points.  N = WNL, D = diminished, C = clear for gross weakness with myotome testing, * = concordant pain with testing)  SPECIAL TESTS:  Straight leg raise: L (-), R (-) Slump: L (-), R (-)  LE ROM:  ROM Right (Eval) Left (Eval)  Hip flexion    Hip extension    Hip abduction    Hip adduction    Hip internal rotation    Hip external rotation    Knee flexion    Knee extension    Ankle dorsiflexion    Ankle plantarflexion    Ankle inversion    Ankle eversion      (Blank rows = not tested, N = WNL, * = concordant pain with testing)  Functional Tests  Eval    30'' STS: 9x  UE used? N    2 MWT: 402'                                                        PATIENT SURVEYS:  ODI: 31/50  TODAY'S TREATMENT   [facility] Adult PT Treatment  04/24/2024:  Therapeutic Exercise: nu-step L5 57m - LE only - while taking subjective and planning session with patient Supine clam - Black TB Supine hip adduction SLR - 2x10 ea LTR - 10x ea Bridge - 2x10 Seated horizontal abd - GTB - 2x10 Bil ER - GTB - 2x10 Standing row - GTB Standing shoulder ext - GTB  Therapeutic Activity  STS 4x5     HOME EXERCISE PROGRAM: Access Code: [email or web address] URL: [email or web address] Date: 04/06/2024 Prepared by: Helene Gasmen  Exercises - Supine Lower Trunk Rotation  - 1 x daily - 7 x weekly - 1 sets - 20 reps - 3 hold - Hooklying Isometric Clamshell  - 1 x daily - 7 x weekly - 3 sets - 10 reps - Sit to Stand Without Arm Support  - 1 x daily - 4-7 x weekly - 3 sets - 5 reps - Supine Shoulder Horizontal Abduction with Resistance  - 1 x daily - 4-7 x weekly - 3 sets - 10 reps - Walking on Treadmill  - 1 x daily - 4-7 x weekly - 10 reps - 5-10 min time  Treatment priorities   Eval        Increase activity during the day        Get chair with back support and lumbar roll        General core/hip/LE strength                           ASSESSMENT:  CLINICAL IMPRESSION:  04/24/2024:  [name] tolerated session well with no adverse reaction.  Has not completed HEP or been able to get a chair yet.  Encouraged her to try and work on increasing activity and get a supportive chair to sit in during the day.  EVAL:  [name] is a 33 y.o. female who presents to clinic with signs and sxs consistent with chronic low back pain.   No sign of radiculopathy on exam.   Appears mechanical in nature.  Ketrina will benefit from skilled PT to address relevant deficits and improve comfort with daily tasks such as childcare and ambulation.   OBJECTIVE IMPAIRMENTS: Pain, LE and core strength, endurance, gait  ACTIVITY LIMITATIONS: walking, standing, sitting, sleeping, housework, childcare  PERSONAL FACTORS: See medical history and pertinent history   REHAB POTENTIAL: Good  CLINICAL DECISION MAKING: Evolving/moderate complexity  EVALUATION COMPLEXITY: Moderate   GOALS:   SHORT TERM GOALS: Target date: 04/19/2024   Rainey will be >75% HEP compliant to improve carryover between sessions and facilitate independent management of condition  Evaluation: ongoing Goal status: INITIAL   LONG TERM GOALS: Target date: 05/17/2024   Unity will self report >/= 50% decrease in pain from evaluation to improve function in daily tasks  Evaluation/Baseline: 10/10 max pain Goal status: INITIAL   2.  Raini will show a >/= 11 pt improvement in their ODI score (MCID is 12% or 6/50 pts) as a proxy for functional improvement   Evaluation/Baseline: 31/50 pts Goal status: INITIAL   3.  Tannisha will be able to stand >45 min to complete housework, not limited by pain  Evaluation/Baseline: limited Goal status: INITIAL   4.  Kennisha will improve two minute walk test to 460 feet (MCID 40 ft).  Evaluation/Baseline: 402 ft Goal status: INITIAL   5.  Jahayra will improve 30'' STS (MCID 2) to >/= 12x (w/ UE?: n) to show improved LE strength and  improved transfers   Evaluation/Baseline: 9x  w/ UE? n Goal status: INITIAL   PLAN: PT FREQUENCY: 1-2x/week  PT DURATION: 8 weeks  PLANNED INTERVENTIONS:  97164- PT Re-evaluation, 97110-Therapeutic exercises, 97530- Therapeutic activity, [id number]- Neuromuscular re-education, 97535- Self Care, 02859- Manual therapy, [id number]- Gait training, [id number]- Aquatic Therapy, [phone]- Electrical stimulation (manual), [id number]- Vasopneumatic device, [id number]- Traction (mechanical), [id number]- Ionotophoresis 4mg /ml Dexamethasone , Taping, Dry Needling, Joint manipulation, and Spinal manipulation.   Sanja Elizardo PT, DPT 04/24/2024, 9:31 AM

## 2024-04-25 ENCOUNTER — Ambulatory Visit (INDEPENDENT_AMBULATORY_CARE_PROVIDER_SITE_OTHER): Admitting: Physical Medicine and Rehabilitation

## 2024-04-25 ENCOUNTER — Encounter: Payer: Self-pay | Admitting: Physical Medicine and Rehabilitation

## 2024-04-25 DIAGNOSIS — M545 Low back pain, unspecified: Secondary | ICD-10-CM

## 2024-04-25 DIAGNOSIS — M7918 Myalgia, other site: Secondary | ICD-10-CM

## 2024-04-25 DIAGNOSIS — G8929 Other chronic pain: Secondary | ICD-10-CM | POA: Diagnosis not present

## 2024-04-25 NOTE — Progress Notes (Signed)
 Pain Scale   Average Pain 0 Patient advising her pain is manageable, patient advising her PT is helping         +Driver, -BT, -Dye Allergies.

## 2024-04-25 NOTE — Progress Notes (Signed)
 Kim Hudson - 33 y.o. female MRN [id number]  Date of birth: April 04, 1991  Office Visit Note: Visit Date: 04/25/2024 PCP: Oley Bascom RAMAN, NP Referred by: Steva Almond Loose Urge*  Subjective: Chief Complaint  Patient presents with   Lower Back - Follow-up   HPI: Kim Hudson is a 33 y.o. female who comes in today for evaluation of chronic lower back pain. She also reports intermittent pain radiating up entire back to shoulders and neck. She is here today in follow up from formal physical therapy. Lower back pain ongoing for several years, started in early 20's. Her pain worsens with movement and activity. She reports discomfort with household activities such as cooking and cleaning. She describes pain as sore, aching and tight sensation, she denies pain at this time. Prior lumbar radiographs show normal alignment and segmentation, well maintained disc height, no spondylolisthesis. She does get some relief of pain with baclofen  and meloxicam . She has completed several sessions of physical therapy, unfortunately she has had some transportation issues. She plans to reschedule soon. She is managed by Dr. Murray Collier with [facility], recently started on Lyrica . She reports no relief of pain with this medication and has subsequently stopped taking. Patient denies focal weakness, numbness and tingling. No recent trauma or falls.      Review of Systems  Musculoskeletal:  Positive for back pain and myalgias.  Neurological:  Negative for tingling, sensory change, focal weakness and weakness.  All other systems reviewed and are negative.  Otherwise per HPI.  Assessment & Plan: Visit Diagnoses:    ICD-10-CM   1. Chronic bilateral low back pain without sciatica  M54.50 MR LUMBAR SPINE WO CONTRAST   G89.29     2. Myofascial pain syndrome  M79.18        Plan: Findings:  Chronic lower back pain. No radicular symptoms down the legs. Patient continues to have pain despite  good conservative therapies such as formal physical therapy, home exercise regimen, rest and use of medications. Patients clinical presentation and exam are complex. She does have diffuse pain to entire back, shoulders and neck regions. I do feel her symptoms could be more of a chronic pain/central sensitization syndrome. Could look at obtaining rheumatological labs. We discussed treatment plan in detail today. Next step is to place order for lumbar MRI imaging. I explained to patient that advanced imaging of her lumbar spine will help us  rule out any structural abnormalities and potential causes for lower back discomfort. I would like her to continue with formal physical therapy as directed. I also encouraged her to continue with home exercises. She can take meloxicam  and baclofen  as needed. I instructed her to follow up with Dr. Collier for further management of her chronic pain. She would likely benefit from medical weight loss consultation as well. She has no questions at this time. No red flag symptoms noted upon exam today.     Meds & Orders: No orders of the defined types were placed in this encounter.   Orders Placed This Encounter  Procedures   MR LUMBAR SPINE WO CONTRAST    Follow-up: Return for Lumbar MRI review.   Procedures: No procedures performed      Clinical History: No specialty comments available.   She reports that she has quit smoking. Her smoking use included cigars. She has never used smokeless tobacco. No results for input(s): HGBA1C, LABURIC in the last 8760 hours.  Objective:  VS:  HT:    WT:  BMI:     BP:   HR: bpm  TEMP: ( )  RESP:  Physical Exam Vitals and nursing note reviewed.  HENT:     Head: Normocephalic and atraumatic.     Right Ear: External ear normal.     Left Ear: External ear normal.     Nose: Nose normal.     Mouth/Throat:     Mouth: Mucous membranes are moist.  Eyes:     Extraocular Movements: Extraocular movements intact.   Cardiovascular:     Rate and Rhythm: Normal rate.     Pulses: Normal pulses.  Pulmonary:     Effort: Pulmonary effort is normal.  Abdominal:     General: Abdomen is flat. There is no distension.  Musculoskeletal:        General: Tenderness present.     Cervical back: Normal range of motion.     Comments: Patient rises from seated position to standing without difficulty. Good lumbar range of motion. No pain noted with facet loading. 5/5 strength noted with bilateral hip flexion, knee flexion/extension, ankle dorsiflexion/plantarflexion and EHL. No clonus noted bilaterally. No pain upon palpation of greater trochanters. No pain with internal/external rotation of bilateral hips. Sensation intact bilaterally. Myofascial tenderness noted upon palpation of bilateral lumbar paraspinal regions. Negative slump test bilaterally. Ambulates without aid, gait steady.     Skin:    General: Skin is warm and dry.     Capillary Refill: Capillary refill takes less than 2 seconds.  Neurological:     General: No focal deficit present.     Mental Status: She is alert and oriented to person, place, and time.  Psychiatric:        Mood and Affect: Mood normal.        Behavior: Behavior normal.     Ortho Exam  Imaging: No results found.  Past Medical/Family/Surgical/Social History: Medications & Allergies reviewed per EMR, new medications updated. Patient Active Problem List   Diagnosis Date Noted   Symptomatic mammary hypertrophy 08/26/2023   Back pain 08/26/2023   Neck pain 08/26/2023   Normal pregnancy in multigravida in third trimester 03/04/2017   SVD (spontaneous vaginal delivery) 03/04/2017   Maternal obesity syndrome, antepartum, third trimester 03/03/2017   Past Medical History:  Diagnosis Date   Back pain    GERD (gastroesophageal reflux disease)    Maternal obesity syndrome, antepartum, third trimester 03/03/2017   Obese    SVD (spontaneous vaginal delivery) 03/04/2017   Family  History  Problem Relation Age of Onset   Hypertension Mother    Past Surgical History:  Procedure Laterality Date   BREAST REDUCTION SURGERY Bilateral 09/19/2023   Procedure: BREAST REDUCTION WITH LIPOSUCTION;  Surgeon: Lowery Estefana RAMAN, DO;  Location: [facility] OR;  Service: Plastics;  Laterality: Bilateral;   NO PAST SURGERIES     Social History   Occupational History   Not on file  Tobacco Use   Smoking status: Former    Types: Cigars   Smokeless tobacco: Never  Vaping Use   Vaping status: Never Used  Substance and Sexual Activity   Alcohol use: No   Drug use: No   Sexual activity: Yes    Birth control/protection: None

## 2024-04-26 ENCOUNTER — Encounter: Payer: Self-pay | Admitting: Physical Therapy

## 2024-04-26 ENCOUNTER — Ambulatory Visit: Admitting: Physical Therapy

## 2024-04-26 ENCOUNTER — Telehealth: Payer: Self-pay

## 2024-04-26 ENCOUNTER — Ambulatory Visit: Payer: Self-pay | Admitting: Nurse Practitioner

## 2024-04-26 DIAGNOSIS — R87610 Atypical squamous cells of undetermined significance on cytologic smear of cervix (ASC-US): Secondary | ICD-10-CM

## 2024-04-26 NOTE — Telephone Encounter (Signed)
 Called pt back to advise of labs. No answer lvm for call back [facility]

## 2024-04-26 NOTE — Telephone Encounter (Signed)
 Copied from CRM #[id number]. Topic: Clinical - Lab/Test Results >> Apr 26, 2024  1:22 PM Amy B wrote: Reason for CRM: Patient requests a call back to discuss her lab results.  She does not understand and has questions  Lvm for call back. KH

## 2024-04-26 NOTE — Therapy (Signed)
 Pt received in lobby and states that she had a situation with her daughter come up and needed to leave

## 2024-05-07 ENCOUNTER — Encounter: Payer: Self-pay | Admitting: Physical Medicine and Rehabilitation

## 2024-05-10 ENCOUNTER — Other Ambulatory Visit

## 2024-05-14 ENCOUNTER — Encounter: Payer: Self-pay | Admitting: Radiology

## 2024-05-24 ENCOUNTER — Encounter: Attending: Physical Medicine & Rehabilitation | Admitting: Physical Medicine & Rehabilitation

## 2024-05-24 DIAGNOSIS — Z6841 Body Mass Index (BMI) 40.0 and over, adult: Secondary | ICD-10-CM | POA: Insufficient documentation

## 2024-05-24 DIAGNOSIS — E66813 Obesity, class 3: Secondary | ICD-10-CM | POA: Insufficient documentation

## 2024-05-24 DIAGNOSIS — G479 Sleep disorder, unspecified: Secondary | ICD-10-CM | POA: Insufficient documentation

## 2024-05-24 DIAGNOSIS — M545 Low back pain, unspecified: Secondary | ICD-10-CM | POA: Insufficient documentation

## 2024-05-24 DIAGNOSIS — M2550 Pain in unspecified joint: Secondary | ICD-10-CM | POA: Insufficient documentation

## 2024-05-24 DIAGNOSIS — G8929 Other chronic pain: Secondary | ICD-10-CM | POA: Insufficient documentation

## 2024-05-28 ENCOUNTER — Other Ambulatory Visit: Payer: Self-pay

## 2024-05-28 ENCOUNTER — Encounter (HOSPITAL_COMMUNITY): Payer: Self-pay

## 2024-05-28 ENCOUNTER — Ambulatory Visit (HOSPITAL_COMMUNITY)
Admission: RE | Admit: 2024-05-28 | Discharge: 2024-05-28 | Disposition: A | Discharge status: Home / Self Care | Source: Ambulatory Visit | Attending: Emergency Medicine | Admitting: Emergency Medicine

## 2024-05-28 VITALS — BP 124/70 | HR 75 | Temp 97.5°F | Resp 17

## 2024-05-28 DIAGNOSIS — K0889 Other specified disorders of teeth and supporting structures: Secondary | ICD-10-CM | POA: Diagnosis not present

## 2024-05-28 MED ORDER — AMOXICILLIN-POT CLAVULANATE 875-125 MG PO TABS
1.0000 | ORAL_TABLET | Freq: Two times a day (BID) | ORAL | 0 refills | Status: DC
  Filled 2024-05-28: qty 14, 7d supply, fill #0

## 2024-05-28 MED ORDER — IBUPROFEN 800 MG PO TABS
800.0000 mg | ORAL_TABLET | Freq: Three times a day (TID) | ORAL | 0 refills | Status: AC
  Filled 2024-05-28: qty 30, 10d supply, fill #0

## 2024-05-28 NOTE — ED Triage Notes (Signed)
 Pt c/o right sided dental pain since it been getting cold outside. Dentist that had doesn't take her medicaid anymore.

## 2024-05-28 NOTE — Discharge Instructions (Addendum)
 Take the antibiotic twice daily for the next 7 days to treat any infection.  Warm saline gargles may help as well.  Use the ibuprofen  3 times daily for help with pain and inflammation.  It is importantly follow-up with a dentist, as this problem may recur until your teeth are properly treated.  Below are some dental resources.  Return to clinic for any new or urgent symptoms.  [facility] in [place], [place]  Address: [address] [place], [place], [place] [address] Phone: [phone]  [facility] [phone] extension [phone] [address]  Dr. Encarnacion [phone] [address]  [facility] [phone] [address] [facility] [place].  [facility] [phone] extension [phone] [place], [place], [address] First come first serve for the first 10 clients.  May do simple extractions only, no wisdom teeth or surgery.  You may try the second for Thursday of the month starting at 6:30 AM.  [facility] You may call the school to see if they are still helping to provide dental care for emergent cases.

## 2024-05-28 NOTE — ED Provider Notes (Signed)
 [facility]    CSN: [id number] Arrival date & time: 05/28/24  9082      History   Chief Complaint Chief Complaint  Patient presents with   appt 930    HPI Kim Hudson is a 33 y.o. female.   Patient presents to clinic over concern of right-sided dental pain.  Has had broken molars for a while now, both upper and lower.  Recently it feels like her teeth have been breaking more more and sometimes they impacted into the gums.  One of her upper molars she noticed with more broken than usual and the gums were painful and swollen.  Feels like the cold weather has aggravated her tooth pain.  Does not currently have a dentist.  Has not had fevers.  Has been having headache and pain radiating down into her neck.  Denies fevers.  Has been taking Excedrin for headaches.  Denies any drainage.  Right upper gums were swollen, this is improved.  The history is provided by the patient and medical records.    Past Medical History:  Diagnosis Date   Back pain    GERD (gastroesophageal reflux disease)    Maternal obesity syndrome, antepartum, third trimester 03/03/2017   Obese    SVD (spontaneous vaginal delivery) 03/04/2017    Patient Active Problem List   Diagnosis Date Noted   Symptomatic mammary hypertrophy 08/26/2023   Back pain 08/26/2023   Neck pain 08/26/2023   Normal pregnancy in multigravida in third trimester 03/04/2017   SVD (spontaneous vaginal delivery) 03/04/2017   Maternal obesity syndrome, antepartum, third trimester 03/03/2017    Past Surgical History:  Procedure Laterality Date   BREAST REDUCTION SURGERY Bilateral 09/19/2023   Procedure: BREAST REDUCTION WITH LIPOSUCTION;  Surgeon: Lowery Estefana RAMAN, DO;  Location: [facility];  Service: Plastics;  Laterality: Bilateral;   NO PAST SURGERIES      OB History     Gravida  1   Para  1   Term  1   Preterm  0   AB  0   Living  1      SAB  0   IAB  0   Ectopic  0   Multiple  0   Live  Births  1            Home Medications    Prior to Admission medications   Medication Sig Start Date End Date Taking? Authorizing Provider  amoxicillin -clavulanate (AUGMENTIN) 875-125 MG tablet Take 1 tablet by mouth every 12 (twelve) hours. 05/28/24  Yes Corene Resnick  N, FNP  ibuprofen  (ADVIL ) 800 MG tablet Take 1 tablet (800 mg total) by mouth 3 (three) times daily. 05/28/24  Yes Bobak Oguinn  N, FNP  meloxicam  (MOBIC ) 15 MG tablet Take 1 tablet (15 mg total) by mouth daily. 03/19/24 03/19/25  Eldonna Novel, MD  pregabalin  (LYRICA ) 75 MG capsule Take 1 capsule (75 mg total) by mouth 2 (two) times daily. 03/23/24   Urbano Albright, MD  omeprazole  (PRILOSEC) 20 MG capsule Take 1 po BID x 2 weeks then once a day Patient not taking: Reported on 06/25/2014 04/26/14 05/05/15  Randol Albany, MD    Family History Family History  Problem Relation Age of Onset   Hypertension Mother     Social History Social History   Tobacco Use   Smoking status: Former    Types: Cigars   Smokeless tobacco: Never  Vaping Use   Vaping status: Never Used  Substance Use Topics   Alcohol  use: No   Drug use: No     Allergies   Patient has no known allergies.   Review of Systems Review of Systems  Per HPI  Physical Exam Triage Vital Signs ED Triage Vitals  Encounter Vitals Group     BP 05/28/24 0958 124/70     Girls Systolic BP Percentile --      Girls Diastolic BP Percentile --      Boys Systolic BP Percentile --      Boys Diastolic BP Percentile --      Pulse Rate 05/28/24 0958 75     Resp 05/28/24 0958 17     Temp 05/28/24 0958 (!) 97.5 F (36.4 C)     Temp Source 05/28/24 0958 Oral     SpO2 05/28/24 0958 98 %     Weight --      Height --      Head Circumference --      Peak Flow --      Pain Score 05/28/24 0957 10     Pain Loc --      Pain Education --      Exclude from Growth Chart --    No data found.  Updated Vital Signs BP 124/70 (BP Location: Right Arm)    Pulse 75   Temp (!) 97.5 F (36.4 C) (Oral)   Resp 17   LMP 05/20/2024 (Approximate)   SpO2 98%   Visual Acuity Right Eye Distance:   Left Eye Distance:   Bilateral Distance:    Right Eye Near:   Left Eye Near:    Bilateral Near:     Physical Exam Vitals and nursing note reviewed.  Constitutional:      Appearance: Normal appearance.  HENT:     Head: Normocephalic and atraumatic.     Right Ear: External ear normal.     Left Ear: External ear normal.     Nose: Nose normal.     Mouth/Throat:     Mouth: Mucous membranes are moist.     Dentition: Abnormal dentition. Dental caries present.     Comments: Extensive dental caries with multiple broken molars of the right upper and lower jaw Eyes:     Conjunctiva/sclera: Conjunctivae normal.  Cardiovascular:     Rate and Rhythm: Normal rate.  Pulmonary:     Effort: Pulmonary effort is normal. No respiratory distress.  Lymphadenopathy:     Cervical: No cervical adenopathy.  Neurological:     General: No focal deficit present.     Mental Status: She is alert and oriented to person, place, and time.  Psychiatric:        Mood and Affect: Mood normal.        Behavior: Behavior normal. Behavior is cooperative.      UC Treatments / Results  Labs (all labs ordered are listed, but only abnormal results are displayed) Labs Reviewed - No data to display  EKG   Radiology No results found.  Procedures Procedures (including critical care time)  Medications Ordered in UC Medications - No data to display  Initial Impression / Assessment and Plan / UC Course  I have reviewed the triage vital signs and the nursing notes.  Pertinent labs & imaging results that were available during my care of the patient were reviewed by me and considered in my medical decision making (see chart for details).  Vitals and triage reviewed, patient is hemodynamically stable.  Multiple broken molars of the right upper and lower jaw.  Gingival  erythema of the right upper side.  Without obvious abscess.  Afebrile and without cervical LAD.  Due to recent increase in pain and impacted molar will treat with Augmentin to prevent any infection.  Provided with dental resources.  Pain management discussed.  Plan of care, follow-up care return precautions given, no questions at this time.    Final Clinical Impressions(s) / UC Diagnoses   Final diagnoses:  Pain, dental     Discharge Instructions      Take the antibiotic twice daily for the next 7 days to treat any infection.  Warm saline gargles may help as well.  Use the ibuprofen  3 times daily for help with pain and inflammation.  It is importantly follow-up with a dentist, as this problem may recur until your teeth are properly treated.  Below are some dental resources.  Return to clinic for any new or urgent symptoms.  [facility] in [place], [place]  Address: [address] [place], [place] [address] Phone: [phone]  [facility] [phone] extension [phone] [address]  Dr. Encarnacion [phone] [address]  [facility] [phone] [address] [facility] [place].  [facility] [phone] extension [phone], [place], [place], [address] First come first serve for the first 10 clients.  May do simple extractions only, no wisdom teeth or surgery.  You may try the second for Thursday of the month starting at 6:30 AM.  [facility] You may call the school to see if they are still helping to provide dental care for emergent cases.     ED Prescriptions     Medication Sig Dispense Auth. Provider   amoxicillin -clavulanate (AUGMENTIN) 875-125 MG tablet Take 1 tablet by mouth every 12 (twelve) hours. 14 tablet Alexiana Laverdure  N, FNP   ibuprofen  (ADVIL ) 800 MG tablet Take 1 tablet (800 mg total) by mouth 3 (three) times daily. 30 tablet Dreama, Peyson Postema  N, FNP      PDMP not reviewed this  encounter.   Dreama, Ritu Gagliardo  N, FNP 05/28/24 1017

## 2024-05-30 ENCOUNTER — Other Ambulatory Visit: Payer: Self-pay

## 2024-05-30 ENCOUNTER — Ambulatory Visit (HOSPITAL_BASED_OUTPATIENT_CLINIC_OR_DEPARTMENT_OTHER): Admitting: Physical Therapy

## 2024-05-30 ENCOUNTER — Ambulatory Visit (HOSPITAL_COMMUNITY)
Admission: EM | Admit: 2024-05-30 | Discharge: 2024-05-30 | Disposition: A | Discharge status: Home / Self Care | Attending: Family Medicine | Admitting: Family Medicine

## 2024-05-30 ENCOUNTER — Encounter (HOSPITAL_COMMUNITY): Payer: Self-pay | Admitting: Emergency Medicine

## 2024-05-30 DIAGNOSIS — N898 Other specified noninflammatory disorders of vagina: Secondary | ICD-10-CM | POA: Diagnosis present

## 2024-05-30 DIAGNOSIS — N939 Abnormal uterine and vaginal bleeding, unspecified: Secondary | ICD-10-CM | POA: Insufficient documentation

## 2024-05-30 LAB — POCT URINE PREGNANCY: Preg Test, Ur: NEGATIVE

## 2024-05-30 MED ORDER — FLUCONAZOLE 150 MG PO TABS
ORAL_TABLET | ORAL | 0 refills | Status: DC
  Filled 2024-05-30: qty 2, 3d supply, fill #0

## 2024-05-30 NOTE — ED Triage Notes (Signed)
 Pt reports noticed itching sensation in vaginal area yesterday.  Reports had sexual intercourse yesterday as well and had bleeding afterwards.

## 2024-05-30 NOTE — Discharge Instructions (Signed)
We have sent testing for various causes of vaginal infections. We will notify you of any positive results once they are received. If required, we will prescribe any medications you might need.  Please refrain from all sexual activity for at least the next seven days.  

## 2024-05-30 NOTE — ED Provider Notes (Signed)
 [facility]   [id number] 05/30/24 Arrival Time: 0802  ASSESSMENT & PLAN:  1. Vaginal itching   2. Vaginal irritation   3. Vaginal spotting    Meds ordered this encounter  Medications   fluconazole  (DIFLUCAN ) 150 MG tablet    Sig: Take one tablet by mouth as a single dose. May repeat in 3 days if symptoms persist.    Dispense:  2 tablet    Refill:  0      Discharge Instructions      We have sent testing for various causes of vaginal infections. We will notify you of any positive results once they are received. If required, we will prescribe any medications you might need.  Please refrain from all sexual activity for at least the next seven days.     Without s/s of PID.  Labs Reviewed  POCT URINE PREGNANCY  CERVICOVAGINAL ANCILLARY ONLY   UPT negative.   Follow-up Information     Oley Bascom RAMAN, NP.   Specialties: Pulmonary Disease, Endocrinology Why: As needed. Contact information: [address] [place] [address] [phone]         Schedule an appointment as soon as possible for a visit  with [facility].   Specialty: Obstetrics and Gynecology Why: If your spotting continues. Contact information: [address] [place]  [address] [phone]                Will notify of any positive results. Instructed to refrain from sexual activity for at least seven days.  Reviewed expectations re: course of current medical issues. Questions answered. Outlined signs and symptoms indicating need for more acute intervention. Patient verbalized understanding. After Visit Summary given.   SUBJECTIVE:  Kim Hudson is a 33 y.o. female who presents with complaint of vaginal irritation/itching; very slight d/c. Onset gradual. First noticed 2 d ago. Describes discharge as thick and white; without odor. Is itching. Would like STI testing. Also mentions some  vaginal spotting s/p intercourse a few days ago; has eased. Patient's last menstrual period was 05/20/2024 (approximate).    OBJECTIVE:  Vitals:   05/30/24 0820  BP: 134/84  Pulse: 69  Resp: 18  Temp: 98.3 F (36.8 C)  TempSrc: Oral  SpO2: 96%     General appearance: alert, cooperative, appears stated age and no distress Lungs: unlabored respirations; speaks full sentences without difficulty Back: no CVA tenderness; FROM at waist Abdomen: soft, non-tender GU: deferred Skin: warm and dry Psychological: alert and cooperative; normal mood and affect.  Results for orders placed or performed during the hospital encounter of 05/30/24  POCT urine pregnancy   Collection Time: 05/30/24  8:48 AM  Result Value Ref Range   Preg Test, Ur Negative Negative    Labs Reviewed  POCT URINE PREGNANCY  CERVICOVAGINAL ANCILLARY ONLY    No Known Allergies  Past Medical History:  Diagnosis Date   Back pain    GERD (gastroesophageal reflux disease)    Maternal obesity syndrome, antepartum, third trimester 03/03/2017   Obese    SVD (spontaneous vaginal delivery) 03/04/2017   Family History  Problem Relation Age of Onset   Hypertension Mother    Social History   Socioeconomic History   Marital status: Single    Spouse name: Not on file   Number of children: Not on file   Years of education: Not on file   Highest education level: GED or equivalent  Occupational  History   Not on file  Tobacco Use   Smoking status: Former    Types: Cigars   Smokeless tobacco: Never  Vaping Use   Vaping status: Never Used  Substance and Sexual Activity   Alcohol use: No   Drug use: No   Sexual activity: Yes    Birth control/protection: None  Other Topics Concern   Not on file  Social History Narrative   Not on file   Social Drivers of Health   Financial Resource Strain: Low Risk  (01/19/2024)   Overall Financial Resource Strain (CARDIA)    Difficulty of Paying Living Expenses: Not hard  at all  Food Insecurity: No Food Insecurity (01/19/2024)   Hunger Vital Sign    Worried About Running Out of Food in the Last Year: Never true    Ran Out of Food in the Last Year: Never true  Transportation Needs: No Transportation Needs (01/19/2024)   PRAPARE - Administrator, Civil Service (Medical): No    Lack of Transportation (Non-Medical): No  Physical Activity: Sufficiently Active (01/19/2024)   Exercise Vital Sign    Days of Exercise per Week: 7 days    Minutes of Exercise per Session: 30 min  Recent Concern: Physical Activity - Insufficiently Active (12/15/2023)   Exercise Vital Sign    Days of Exercise per Week: 3 days    Minutes of Exercise per Session: 30 min  Stress: No Stress Concern Present (01/19/2024)   [employer] of Occupational Health - Occupational Stress Questionnaire    Feeling of Stress: Not at all  Social Connections: Socially Isolated (01/19/2024)   Social Connection and Isolation Panel    Frequency of Communication with Friends and Family: Once a week    Frequency of Social Gatherings with Friends and Family: Never    Attends Religious Services: Never    Database Administrator or Organizations: No    Attends Banker Meetings: Not on file    Marital Status: Never married  Intimate Partner Violence: Not on file           [place], MD 05/30/24 [phone]

## 2024-05-31 ENCOUNTER — Ambulatory Visit (HOSPITAL_COMMUNITY): Payer: Self-pay

## 2024-05-31 LAB — CERVICOVAGINAL ANCILLARY ONLY
Bacterial Vaginitis (gardnerella): NEGATIVE
Candida Glabrata: NEGATIVE
Candida Vaginitis: POSITIVE — AB
Chlamydia: NEGATIVE
Comment: NEGATIVE
Comment: NEGATIVE
Comment: NEGATIVE
Comment: NEGATIVE
Comment: NEGATIVE
Comment: NORMAL
Neisseria Gonorrhea: NEGATIVE
Trichomonas: NEGATIVE

## 2024-06-01 ENCOUNTER — Other Ambulatory Visit: Payer: Self-pay

## 2024-06-01 ENCOUNTER — Other Ambulatory Visit: Payer: Self-pay | Admitting: Nurse Practitioner

## 2024-06-04 ENCOUNTER — Ambulatory Visit (HOSPITAL_BASED_OUTPATIENT_CLINIC_OR_DEPARTMENT_OTHER): Admitting: Physical Therapy

## 2024-06-06 ENCOUNTER — Ambulatory Visit (HOSPITAL_BASED_OUTPATIENT_CLINIC_OR_DEPARTMENT_OTHER): Payer: Self-pay | Admitting: Physical Therapy

## 2024-06-11 ENCOUNTER — Ambulatory Visit (HOSPITAL_BASED_OUTPATIENT_CLINIC_OR_DEPARTMENT_OTHER): Payer: Self-pay | Admitting: Physical Therapy

## 2024-06-12 ENCOUNTER — Other Ambulatory Visit: Payer: Self-pay

## 2024-06-22 ENCOUNTER — Encounter: Payer: Self-pay | Admitting: Physical Medicine & Rehabilitation

## 2024-06-22 ENCOUNTER — Encounter: Attending: Physical Medicine & Rehabilitation | Admitting: Physical Medicine & Rehabilitation

## 2024-06-22 ENCOUNTER — Other Ambulatory Visit: Payer: Self-pay

## 2024-06-22 VITALS — BP 113/72 | HR 70 | Ht 67.0 in | Wt 338.8 lb

## 2024-06-22 DIAGNOSIS — G8929 Other chronic pain: Secondary | ICD-10-CM | POA: Diagnosis not present

## 2024-06-22 DIAGNOSIS — M545 Low back pain, unspecified: Secondary | ICD-10-CM | POA: Diagnosis present

## 2024-06-22 DIAGNOSIS — M2550 Pain in unspecified joint: Secondary | ICD-10-CM | POA: Insufficient documentation

## 2024-06-22 DIAGNOSIS — G479 Sleep disorder, unspecified: Secondary | ICD-10-CM | POA: Insufficient documentation

## 2024-06-22 MED ORDER — PREGABALIN 100 MG PO CAPS
100.0000 mg | ORAL_CAPSULE | Freq: Two times a day (BID) | ORAL | 2 refills | Status: AC
  Filled 2024-06-22: qty 60, 30d supply, fill #0
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
## 2024-07-10 ENCOUNTER — Ambulatory Visit (HOSPITAL_COMMUNITY)
Admission: EM | Admit: 2024-07-10 | Discharge: 2024-07-10 | Disposition: A | Discharge status: Home / Self Care | Attending: Emergency Medicine | Admitting: Emergency Medicine

## 2024-07-10 ENCOUNTER — Ambulatory Visit: Admitting: Nurse Practitioner

## 2024-07-10 ENCOUNTER — Other Ambulatory Visit: Payer: Self-pay

## 2024-07-10 ENCOUNTER — Ambulatory Visit: Payer: Self-pay

## 2024-07-10 DIAGNOSIS — N3000 Acute cystitis without hematuria: Secondary | ICD-10-CM | POA: Insufficient documentation

## 2024-07-10 DIAGNOSIS — R3915 Urgency of urination: Secondary | ICD-10-CM | POA: Diagnosis not present

## 2024-07-10 LAB — POCT URINE DIPSTICK
Bilirubin, UA: NEGATIVE
Glucose, UA: NEGATIVE mg/dL
Ketones, POC UA: NEGATIVE mg/dL
Nitrite, UA: NEGATIVE
POC PROTEIN,UA: NEGATIVE
Spec Grav, UA: 1.01
Urobilinogen, UA: 0.2 U/dL
pH, UA: 5.5

## 2024-07-10 LAB — POCT URINE PREGNANCY: Preg Test, Ur: NEGATIVE

## 2024-07-10 MED ORDER — SULFAMETHOXAZOLE-TRIMETHOPRIM 800-160 MG PO TABS
1.0000 | ORAL_TABLET | Freq: Two times a day (BID) | ORAL | 0 refills | Status: AC
  Filled 2024-07-10: qty 6, 3d supply, fill #0

## 2024-07-10 NOTE — ED Provider Notes (Signed)
 " [facility]    CSN: [id number] Arrival date & time: 07/10/24  1055      History   Chief Complaint Chief Complaint  Patient presents with   Urinary Urgency    HPI Kim Hudson is a 33 y.o. female.   Patient presents to clinic over concern of bladder pressure and urinary urgency since yesterday   Has not had pain with urination but the end of urination 'made me jump'  Pressure in the bladder  Had yogurt and cranberry juice and this helped ease the pressure  Has not seen discharge or vaginal odor, no new sexual partners and has been using condoms   Denies hematuria or abdominal pain, without fever  Reports all she drinks is water and she urinates after intercourse    The history is provided by the patient and medical records.    Past Medical History:  Diagnosis Date   Back pain    GERD (gastroesophageal reflux disease)    Maternal obesity syndrome, antepartum, third trimester 03/03/2017   Obese    SVD (spontaneous vaginal delivery) 03/04/2017    Patient Active Problem List   Diagnosis Date Noted   Symptomatic mammary hypertrophy 08/26/2023   Back pain 08/26/2023   Neck pain 08/26/2023   Normal pregnancy in multigravida in third trimester 03/04/2017   SVD (spontaneous vaginal delivery) 03/04/2017   Maternal obesity syndrome, antepartum, third trimester 03/03/2017    Past Surgical History:  Procedure Laterality Date   BREAST REDUCTION SURGERY Bilateral 09/19/2023   Procedure: BREAST REDUCTION WITH LIPOSUCTION;  Surgeon: Lowery Estefana RAMAN, DO;  Location: [facility] OR;  Service: Plastics;  Laterality: Bilateral;   NO PAST SURGERIES      OB History     Gravida  1   Para  1   Term  1   Preterm  0   AB  0   Living  1      SAB  0   IAB  0   Ectopic  0   Multiple  0   Live Births  1            Home Medications    Prior to Admission medications  Medication Sig Start Date End Date Taking? Authorizing Provider  pregabalin  (LYRICA )  100 MG capsule Take 1 capsule (100 mg total) by mouth 2 (two) times daily. 06/22/24  Yes Urbano Albright, MD  sulfamethoxazole-trimethoprim (BACTRIM DS) 800-160 MG tablet Take 1 tablet by mouth 2 (two) times daily for 3 days. 07/10/24 07/13/24 Yes Lamia Mariner  N, FNP  ibuprofen  (ADVIL ) 800 MG tablet Take 1 tablet (800 mg total) by mouth 3 (three) times daily. 05/28/24   Dreama, Shawndrea Rutkowski  N, FNP  meloxicam  (MOBIC ) 15 MG tablet Take 1 tablet (15 mg total) by mouth daily. 03/19/24 03/19/25  Eldonna Novel, MD    Family History Family History  Problem Relation Age of Onset   Hypertension Mother     Social History Social History[1]   Allergies   Patient has no known allergies.   Review of Systems Review of Systems  Per HPI  Physical Exam Triage Vital Signs ED Triage Vitals  Encounter Vitals Group     BP 07/10/24 1223 132/77     Girls Systolic BP Percentile --      Girls Diastolic BP Percentile --      Boys Systolic BP Percentile --      Boys Diastolic BP Percentile --      Pulse Rate 07/10/24 1223 73  Resp 07/10/24 1223 18     Temp 07/10/24 1223 98.1 F (36.7 C)     Temp Source 07/10/24 1223 Oral     SpO2 07/10/24 1223 94 %     Weight 07/10/24 1222 (!) 340 lb (154.2 kg)     Height 07/10/24 1222 5' 7 (1.702 m)     Head Circumference --      Peak Flow --      Pain Score 07/10/24 1222 0     Pain Loc --      Pain Education --      Exclude from Growth Chart --    No data found.  Updated Vital Signs BP 132/77 (BP Location: Right Arm)   Pulse 73   Temp 98.1 F (36.7 C) (Oral)   Resp 18   Ht 5' 7 (1.702 m)   Wt (!) 340 lb (154.2 kg)   LMP 06/15/2024   SpO2 94%   BMI 53.25 kg/m   Visual Acuity Right Eye Distance:   Left Eye Distance:   Bilateral Distance:    Right Eye Near:   Left Eye Near:    Bilateral Near:     Physical Exam Vitals and nursing note reviewed.  Constitutional:      Appearance: Normal appearance.  HENT:     Head: Normocephalic  and atraumatic.     Right Ear: External ear normal.     Left Ear: External ear normal.     Nose: Nose normal.     Mouth/Throat:     Mouth: Mucous membranes are moist.  Eyes:     Conjunctiva/sclera: Conjunctivae normal.  Cardiovascular:     Rate and Rhythm: Normal rate.  Pulmonary:     Effort: Pulmonary effort is normal. No respiratory distress.  Neurological:     General: No focal deficit present.     Mental Status: She is alert.  Psychiatric:        Mood and Affect: Mood normal.      UC Treatments / Results  Labs (all labs ordered are listed, but only abnormal results are displayed) Labs Reviewed  POCT URINE DIPSTICK - Abnormal; Notable for the following components:      Result Value   Clarity, UA cloudy (*)    Blood, UA moderate (*)    Leukocytes, UA Trace (*)    All other components within normal limits  URINE CULTURE  POCT URINE PREGNANCY    EKG   Radiology No results found.  Procedures Procedures (including critical care time)  Medications Ordered in UC Medications - No data to display  Initial Impression / Assessment and Plan / UC Course  I have reviewed the triage vital signs and the nursing notes.  Pertinent labs & imaging results that were available during my care of the patient were reviewed by me and considered in my medical decision making (see chart for details).  Vitals and triage reviewed, patient is hemodynamically stable.   UA w/ RBCs and trace leukocytes. Urine pregnancy negative. Will send for culture and trt for acute cystitis w/ Bactrim. Without flank pain, abdominal pain, N/V or s/s of pyelonephritis.   Vaginal testing deferred, without vaginal s/s  Plan of care, follow-up care and return precautions given, no questions at this time.    Final Clinical Impressions(s) / UC Diagnoses   Final diagnoses:  Urinary urgency  Acute cystitis without hematuria     Discharge Instructions      Take the bactrim twice daily for three days  with food  Continue drinking lots of water  We will call if we need to change or update your treatment   Return to clinic for new or urgent symptoms      ED Prescriptions     Medication Sig Dispense Auth. Provider   sulfamethoxazole-trimethoprim (BACTRIM DS) 800-160 MG tablet Take 1 tablet by mouth 2 (two) times daily for 3 days. 6 tablet Dreama, Taisei Bonnette  N, FNP      PDMP not reviewed this encounter.     [1]  Social History Tobacco Use   Smoking status: Former    Types: Cigars   Smokeless tobacco: Never  Vaping Use   Vaping status: Never Used  Substance Use Topics   Alcohol use: No   Drug use: No     Dreama, Nacole Fluhr  N, FNP 07/10/24 1300  "

## 2024-07-10 NOTE — Telephone Encounter (Signed)
 Message from Berwyn MATSU sent at 07/10/2024  8:06 AM EST  Reason for Triage: patient is having pressure/discomfort while peeing started 2 x days.  Patient states output is clear no smell.

## 2024-07-10 NOTE — Telephone Encounter (Signed)
 Lvm for pt to call for an opening we may have today.   kh

## 2024-07-10 NOTE — Telephone Encounter (Signed)
 FYI Only or Action Required?: FYI only for provider: appointment scheduled on 12/30.  Patient was last seen in primary care on 04/04/2024 by Oley Bascom RAMAN, NP.  Called Nurse Triage reporting Dysuria.  Symptoms began several days ago.  Interventions attempted: Rest, hydration, or home remedies.  Symptoms are: gradually improving.  Triage Disposition: See Physician Within 24 Hours  Patient/caregiver understands and will follow disposition?: Yes       Message from Berwyn MATSU sent at 07/10/2024  8:06 AM EST   Reason for Triage: patient is having pressure/discomfort while peeing started 2 x days.  Patient states output is clear no smell.          Reason for Disposition  All other patients with painful urination  (Exception: [1] EITHER frequency or urgency AND [2] has on-call doctor.)  Answer Assessment - Initial Assessment Questions 2 days of increased bladder pressure/discomfort while voiding. Denies fever, pain, or abnormal odor. Endorses occasional blood in urine- just a spot here and there, last seen a week ago. Drinking a lot of water and cranberry- symptoms have eased up slightly but concerned about the pressure. Reports white discharge like substance in urine yesterday but has resolved  Dairy intolerance- ice cream and milk-- has eaten frequently lately- having lots of abdominal cramping and diarrhea due to that. Appt with PCP office this afternoon to assess. UC/ED precautions understood.   1. SEVERITY: How bad is the pain?  (e.g., Scale 1-10; mild, moderate, or severe)     Denies pain just aware  2. FREQUENCY: How many times have you had painful urination today?      Has been drinking a lot of water  3. PATTERN: Is pain present every time you urinate or just sometimes?      Easing up now  4. ONSET: When did the painful urination start?      2 days  5. FEVER: Do you have a fever? If Yes, ask: What is your temperature, how was it measured, and when did it  start?     Denies  6. PAST UTI: Have you had a urine infection before? If Yes, ask: When was the last time? and What happened that time?      Has previously  7. CAUSE: What do you think is causing the painful urination?  (e.g., UTI, scratch, Herpes sore)     Dairy consumption  8. OTHER SYMPTOMS: Do you have any other symptoms? (e.g., blood in urine, flank pain, genital sores, urgency, vaginal discharge)     Denies  9. PREGNANCY: Is there any chance you are pregnant? When was your last menstrual period?     Denies  Protocols used: Urination Pain - Female-A-AH

## 2024-07-10 NOTE — Discharge Instructions (Addendum)
 Take the bactrim twice daily for three days with food  Continue drinking lots of water  We will call if we need to change or update your treatment   Return to clinic for new or urgent symptoms

## 2024-07-10 NOTE — ED Triage Notes (Signed)
 Patient c/o urinary urgency and pressure when urinating that started yesterday.  Denies any hematuria.

## 2024-07-11 LAB — URINE CULTURE

## 2024-07-13 ENCOUNTER — Ambulatory Visit (HOSPITAL_COMMUNITY): Payer: Self-pay

## 2024-08-06 ENCOUNTER — Ambulatory Visit (HOSPITAL_BASED_OUTPATIENT_CLINIC_OR_DEPARTMENT_OTHER): Admitting: Physical Therapy

## 2024-08-20 ENCOUNTER — Ambulatory Visit (HOSPITAL_BASED_OUTPATIENT_CLINIC_OR_DEPARTMENT_OTHER): Admitting: Physical Therapy

## 2024-08-23 ENCOUNTER — Encounter: Admitting: Physical Medicine & Rehabilitation

## 2024-08-24 ENCOUNTER — Encounter (HOSPITAL_BASED_OUTPATIENT_CLINIC_OR_DEPARTMENT_OTHER): Admitting: Obstetrics and Gynecology

## 2024-09-10 ENCOUNTER — Ambulatory Visit (HOSPITAL_BASED_OUTPATIENT_CLINIC_OR_DEPARTMENT_OTHER): Admitting: Physical Therapy
